# Patient Record
Sex: Male | Born: 1995 | Race: Black or African American | Hispanic: No | Marital: Single | Smoking: Never smoker
Health system: Southern US, Community
[De-identification: ages and names within clinical notes are randomized; demographics above are authoritative.]

## PROBLEM LIST (undated history)

## (undated) DIAGNOSIS — N62 Hypertrophy of breast: Secondary | ICD-10-CM

## (undated) DIAGNOSIS — E063 Autoimmune thyroiditis: Secondary | ICD-10-CM

## (undated) DIAGNOSIS — F845 Asperger's syndrome: Secondary | ICD-10-CM

## (undated) DIAGNOSIS — I10 Essential (primary) hypertension: Secondary | ICD-10-CM

## (undated) DIAGNOSIS — Z9119 Patient's noncompliance with other medical treatment and regimen: Secondary | ICD-10-CM

## (undated) DIAGNOSIS — E049 Nontoxic goiter, unspecified: Secondary | ICD-10-CM

## (undated) DIAGNOSIS — E782 Mixed hyperlipidemia: Secondary | ICD-10-CM

## (undated) DIAGNOSIS — E119 Type 2 diabetes mellitus without complications: Secondary | ICD-10-CM

## (undated) DIAGNOSIS — Z91199 Patient's noncompliance with other medical treatment and regimen due to unspecified reason: Secondary | ICD-10-CM

## (undated) DIAGNOSIS — E66813 Obesity, class 3: Secondary | ICD-10-CM

## (undated) DIAGNOSIS — F209 Schizophrenia, unspecified: Secondary | ICD-10-CM

## (undated) HISTORY — DX: Hypertrophy of breast: N62

## (undated) HISTORY — DX: Schizophrenia, unspecified: F20.9

## (undated) HISTORY — DX: Nontoxic goiter, unspecified: E04.9

## (undated) HISTORY — DX: Mixed hyperlipidemia: E78.2

## (undated) HISTORY — DX: Autoimmune thyroiditis: E06.3

## (undated) HISTORY — DX: Patient's noncompliance with other medical treatment and regimen: Z91.19

## (undated) HISTORY — PX: CIRCUMCISION: SUR203

## (undated) HISTORY — DX: Morbid (severe) obesity due to excess calories: E66.01

## (undated) HISTORY — DX: Essential (primary) hypertension: I10

## (undated) HISTORY — DX: Type 2 diabetes mellitus without complications: E11.9

## (undated) HISTORY — DX: Patient's noncompliance with other medical treatment and regimen due to unspecified reason: Z91.199

## (undated) HISTORY — DX: Obesity, class 3: E66.813

## (undated) HISTORY — DX: Asperger's syndrome: F84.5

---
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
## 2014-02-02 ENCOUNTER — Ambulatory Visit: Payer: BC Managed Care – PPO | Admitting: Pediatric Endocrinology

## 2014-03-09 ENCOUNTER — Telehealth: Payer: Self-pay | Admitting: Pediatric Endocrinology

## 2014-03-09 NOTE — Telephone Encounter (Signed)
appt made for 12/10, explained that they have had 2 no shows this year and any more will result in us no longer following him. KW

## 2014-04-15 ENCOUNTER — Ambulatory Visit (INDEPENDENT_AMBULATORY_CARE_PROVIDER_SITE_OTHER): Payer: BC Managed Care – PPO | Admitting: Pediatric Endocrinology

## 2014-04-15 ENCOUNTER — Encounter: Payer: Self-pay | Admitting: Pediatric Endocrinology

## 2014-04-15 VITALS — BP 120/75 | HR 76 | Wt 272.0 lb

## 2014-04-15 DIAGNOSIS — E669 Obesity, unspecified: Secondary | ICD-10-CM | POA: Diagnosis not present

## 2014-04-15 DIAGNOSIS — E1165 Type 2 diabetes mellitus with hyperglycemia: Secondary | ICD-10-CM | POA: Diagnosis not present

## 2014-04-15 DIAGNOSIS — IMO0002 Reserved for concepts with insufficient information to code with codable children: Secondary | ICD-10-CM

## 2014-04-15 DIAGNOSIS — N62 Hypertrophy of breast: Secondary | ICD-10-CM | POA: Diagnosis not present

## 2014-04-15 DIAGNOSIS — E119 Type 2 diabetes mellitus without complications: Secondary | ICD-10-CM

## 2014-04-15 LAB — GLUCOSE, POCT (MANUAL RESULT ENTRY): POC GLUCOSE: 107 mg/dL — AB (ref 70–99)

## 2014-04-15 LAB — POCT GLYCOSYLATED HEMOGLOBIN (HGB A1C): HEMOGLOBIN A1C: 7.4

## 2014-04-15 MED ORDER — LIRAGLUTIDE 18 MG/3ML ~~LOC~~ SOPN: 1.2000 mg | PEN_INJECTOR | Freq: Every day | SUBCUTANEOUS | Status: DC

## 2014-04-15 MED ORDER — METFORMIN HCL 1000 MG PO TABS: 1000.0000 mg | ORAL_TABLET | Freq: Two times a day (BID) | ORAL | Status: DC

## 2014-04-15 MED ORDER — INSULIN PEN NEEDLE 32G X 4 MM MISC: 1.0000 | Freq: Every day | Status: DC

## 2014-04-15 NOTE — Progress Notes (Signed)
Subjective:  Subjective Patient Name: Jared Potts Date of Birth: 1995-09-25  MRN: [id number]  Jared Potts  presents to the office today for follow-up evaluation and management of his type 2 diabetes, obesity, dyspepsia, schizophrenia, gynecomastia, hypertension, hyperlipidemia, thyroiditis, and noncompliance.  HISTORY OF PRESENT ILLNESS:   Jared Potts is a 18 y.o. AA male   Jared Potts was accompanied by himself  1. On 05/11/09 the then 18 year old patient was admitted to the pediatric ward at [name] for evaluation and treatment of new onset diabetes, mild-moderate diabetic ketoacidosis, dehydration, ketonuria, morbid obesity, acanthosis nigricans and gynecomastia in the setting of co-existent schizophrenia. Since his C-peptide was surprisingly low at 0.62 (normal 0.80-3.9) he was initially classified as having T1DM and was treated with Lantus as a basal insulin and Novolog aspart insulin, according to the 150/50/15 plan,  at mealtimes, bedtime, and 2:00 AM. Metformin was added to help with insulin resistance.     2. The patient's last [facility] visit was on 09/2113. In the interim, he has been generally healthy. He was using Bydureon last spring with a good reduction in his hemoglobin A1C to <6%. However, he had some discomfort with injection and when his prescription ran out he stopped taking his medication. When he was taking the medication he was less hungry and found that he got full faster. Since he has stopped taking it he has noticed sugars into the 200s- even when he does not feel he has eaten anything sweet. He admits that he has been eating a lot of sandwiches. He has even had sugars into the 300s.  He is not checking blood sugars regularly but has about a sugar per day on average. He is also doing some physical activity with a weight lifting class twice a week.  He is interested in trying a new treatment option and would prefer not to restart Bydureon. He has not recently been  taking his Metformin either.   3. Pertinent Review of Systems:  Constitutional: The patient feels "about an 7 on a scale of 1 to 10". The patient seems healthy and active. Eyes: Vision seems to be good. There are no recognized eye problems. Neck: The patient has no complaints of anterior neck swelling, soreness, tenderness, pressure, discomfort, or difficulty swallowing.   Heart: Heart rate increases with exercise or other physical activity. The patient has no complaints of palpitations, irregular heart beats, chest pain, or chest pressure.   Gastrointestinal: Bowel movents seem normal. The patient has no complaints of excessive hunger, acid reflux, upset stomach, stomach aches or pains, diarrhea, or constipation.  Legs: Muscle mass and strength seem normal. There are no complaints of numbness, tingling, burning, or pain. No edema is noted.  Feet: There are no obvious foot problems. There are no complaints of numbness, tingling, burning, or pain. No edema is noted. Neurologic: There are no recognized problems with muscle movement and strength, sensation, or coordination.  PAST MEDICAL, FAMILY, AND SOCIAL HISTORY  Past Medical History  Diagnosis Date  . Type 2 diabetes mellitus in patient age 6-19 years with HbA1C goal below 7.5   . Obesity, Class III, BMI 40-49.9 (morbid obesity)   . 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 Goiter   . Gynecomastia, male   . Hypertension   . Combined hyperlipidemia   . Attention deficit disorder   . Thyroiditis, autoimmune   . Noncompliance with treatment     Family History  Problem Relation Age of Onset  . Obesity Mother   . Diabetes  Maternal Aunt   . Obesity Maternal Aunt   . Diabetes Maternal Grandmother   . Obesity Maternal Grandmother   . Cancer Neg Hx     Current outpatient prescriptions: amphetamine-dextroamphetamine (ADDERALL) 30 MG tablet, Take 40 mg by mouth daily. , Disp: , Rfl: ;  ARIPiprazole (ABILIFY) 20 MG tablet, Take 20 mg by mouth daily.,  Disp: , Rfl: ;  Exenatide ER (BYDUREON) 2 MG PEN, Inject 2 mg once weekly, Disp: 4 each, Rfl: 6;  glucose blood (ONETOUCH VERIO) test strip, Check blood sugar 6 x daily, Disp: 200 each, Rfl: 6 Insulin Pen Needle 31G X 6 MM MISC, For use with [name] Pen Device. 1 Injection per day., Disp: 30 each, Rfl: 3;  metFORMIN (GLUCOPHAGE) 1000 MG tablet, Take 1 tablet (1,000 mg total) by mouth 2 (two) times daily with a meal., Disp: 60 tablet, Rfl: 4;  B-D ULTRAFINE III SHORT PEN 31G X 8 MM MISC, USE WITH INSULIN PEN DEVICE . INJECT INSULIN FIVE TIMES A DAY., Disp: 150 each, Rfl: 4 insulin aspart (NOVOLOG FLEXPEN) 100 UNIT/ML FlexPen, Use up to 50 units daily (Patient not taking: Reported on 04/15/2014), Disp: 5 pen, Rfl: 6;  Insulin Glargine (LANTUS SOLOSTAR) 100 UNIT/ML Solostar Pen, Use up to 50 units daily (Patient not taking: Reported on 04/15/2014), Disp: 5 pen, Rfl: 6;  Insulin Pen Needle (NOVOFINE PLUS) 32G X 4 MM MISC, 1 each by Does not apply route daily., Disp: 30 each, Rfl: 6 Liraglutide ([name]) 18 MG/3ML SOPN, Inject 1.2 mg into the skin daily., Disp: 2 pen, Rfl: 6  Allergies as of 04/15/2014  . (No Known Allergies)     reports that he has never smoked. He has never used smokeless tobacco. He reports that he does not drink alcohol or use illicit drugs. Pediatric History  Patient Guardian Status  . Mother:  Jared Potts,Jared Potts   Other Topics Concern  . Not on file   Social History Narrative   Lives with mom and dad and brother.   [employer] with [employer] of [employer]  Primary Care Provider: Evlyn Potts,Jared Potts, Jared Potts  ROS: There are no other significant problems involving Jared Potts's other body systems.    Objective:  Objective Vital Signs:  BP 120/75 mmHg  Pulse 76  Wt 272 lb (123.378 kg)   Ht Readings from Last 3 Encounters:  12/30/12 6' 0.44" (1.84 m) (87 %*, Z = 1.11)  11/17/12 6' 0.5" (1.842 m) (87 %*, Z = 1.14)  06/17/12 6' 0.44" (1.84 m) (88 %*, Z = 1.16)   * Growth percentiles  are based on CDC 2-20 Years data.   Wt Readings from Last 3 Encounters:  04/15/14 272 lb (123.378 kg) (100 %*, Z = 2.73)  09/24/13 267 lb (121.11 kg) (100 %*, Z = 2.66)  07/09/13 272 lb 9.6 oz (123.651 kg) (100 %*, Z = 2.74)   * Growth percentiles are based on CDC 2-20 Years data.   HC Readings from Last 3 Encounters:  [facility]   There is no height on file to calculate BSA. No height on file for this encounter. 100%ile (Z=2.73) based on CDC 2-20 Years weight-for-age data using vitals from 04/15/2014.    PHYSICAL EXAM:  Constitutional: The patient appears healthy and well nourished. The patient's height and weight are advanced for age.  Head: The head is normocephalic. Face: The face appears normal. There are no obvious dysmorphic features. Eyes: The eyes appear to be normally formed and spaced. Gaze is conjugate. There is  no obvious arcus or proptosis. Moisture appears normal. Ears: The ears are normally placed and appear externally normal. Mouth: The oropharynx and tongue appear normal. Dentition appears to be normal for age. Oral moisture is normal. Neck: The neck appears to be visibly normal. The thyroid gland is 15 grams in size. The consistency of the thyroid gland is normal. The thyroid gland is not tender to palpation. +acanthosis Lungs: The lungs are clear to auscultation. Air movement is good. Heart: Heart rate and rhythm are regular. Heart sounds S1 and S2 are normal. I did not appreciate any pathologic cardiac murmurs. Abdomen: The abdomen appears to be normal in size for the patient's age. Bowel sounds are normal. There is no obvious hepatomegaly, splenomegaly, or other mass effect.  Arms: Muscle size and bulk are normal for age. Hands: There is no obvious tremor. Phalangeal and metacarpophalangeal joints are normal. Palmar muscles are normal for age. Palmar skin is normal. Palmar moisture is also normal. Legs: Muscles appear normal for age. No edema is  present. Feet: Feet are normally formed. Dorsalis pedal pulses are normal. Multiple toe nails black and deformed. Right great toe with nail rising from nail bed.  Neurologic: Strength is normal for age in both the upper and lower extremities. Muscle tone is normal. Sensation to touch is normal in both the legs and feet.   GYN: + gynecomastia  LAB DATA:  Results for orders placed or performed in visit on 04/15/14  POCT Glucose (CBG)  Result Value Ref Range   POC Glucose 107 (A) 70 - 99 mg/dl  POCT HgB [id number]  Result Value Ref Range   Hemoglobin A1C 7.4         Assessment and Plan:  Assessment ASSESSMENT:  1. Type 2 diabetes- Had excellent reduction in A1C at last visit when he was taking his Metformin and Bydureon. However, he is not currently taking either and he has had significant increase in A1C level. 2. Weight- modest weight gain.  3. Acanthosis- consistent with insulin resistance 4. 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   PLAN:  1. Diagnostic: A1C as above. Fasting labs prior to next visit.  2. Therapeutic: Restart Metformin. Start [name] at 0.6 mg per day x 1 week then 1.2 mg per day. Sample pen demo'd and given to patient in clinic today.  3. Patient education: Reviewed bg log and A1C result. Discussed weight loss goals and dietary changes. Discussed implications of missed clinic visits and discontinuation of his medication. He would like to start fresh now. 4. Follow-up: Return in about 3 months (around 07/15/2014).      Cammie SickleBADIK, Jarad Barth REBECCA, Jared Potts

## 2014-04-15 NOTE — Patient Instructions (Addendum)
Start [name] 0.6 mg daily x 1 week  Then 1.2 mg daily.  Continue to check blood sugars. You should not get hypoglycemic with this medication. Hopefully you will see sugars starting to improve by the end of the year.   Will recheck your A1C at next visit and see if we need to increase your dose further.   Restart Metformin. Start with 1 dose a day and increase back to 2 doses a day.

## 2014-06-14 ENCOUNTER — Other Ambulatory Visit: Payer: Self-pay | Admitting: *Deleted

## 2014-06-14 DIAGNOSIS — E119 Type 2 diabetes mellitus without complications: Secondary | ICD-10-CM

## 2014-06-25 ENCOUNTER — Encounter: Payer: Self-pay | Admitting: *Deleted

## 2014-06-25 NOTE — Progress Notes (Signed)
On April 15, 2014 patient was given 1 sample victoza pen lot# [id number], exp 2/17

## 2014-07-13 ENCOUNTER — Telehealth: Payer: Self-pay | Admitting: "Endocrinology

## 2014-07-13 ENCOUNTER — Telehealth: Payer: Self-pay | Admitting: *Deleted

## 2014-07-13 NOTE — Telephone Encounter (Signed)
Received telephone call from other. Dr. Tomasa Randunningham put him on olanzapine in December 2015. BGs increased in January and have stayed fairly  high ever since. He stopped his [name] and is taking Lantus. His Lantus dose is 50 units. 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  1. BG log: 2 AM, Breakfast, Lunch, Supper, Bedtime 07/11/14: xxx, xxx, 358 post-prandial, xxx, xxx 07/12/14: xxx, xxx, 290, 345, xxx 07/12/14: xxx, xxx, xxx, 345, xxx 2. Assessment:   A. I don't have enough data to make an intelligent decision about adjusting his Lantus insulin dose.   B. Due to his mental health problems, Jared Potts lacks the insight to see how his non-compliance affects his health. Due to his inability to focus on the daily tasks of DM care, Jared Potts need a very simplified regimen that will not predispose him to hypoglycemia. He was doing well on the combination of Bydureon and metformin before, but he stopped taking them. Mom wants to blame his poor BG controls on olanzapine. While the olanzapine may or may not cause an increase in appetite, it is not adversely affecting Jared Potts's ability to take care of his DM. It is his inherent mental health problems that are doing so. Mom, understandably, wants Jared Potts to be normal and hopes that each new medication will do the trick.   C. If Jared Potts will allow his mother to supervise his DM care and if mom will do so, then the combination of Bydureon and metformin can work very well.  3. Plan: Check BGs regularly for 3 days and call me with the results. Plan to re-start him on Bydureon and metformin. Jared Potts,Cheria Sadiq J

## 2014-07-13 NOTE — Telephone Encounter (Signed)
Spoke to mother, she advises that Jared Potts's sugars have been running high since his behavorial health doctor placed him on a new medication. She had him stop the [name] and restart lantus. Since Jared Potts is out and Jared Potts is full until mid April I offered to transfer his care to [facility]. Mom refuses that option. I made him an appt for 4/19 and told her to call tonight after 8pm and talk to Jared Potts about the amount of insulin he should be on. Mom states she would call this evening. KW

## 2014-07-18 ENCOUNTER — Telehealth: Payer: Self-pay | Admitting: "Endocrinology

## 2014-07-18 NOTE — Telephone Encounter (Signed)
Received telephone call from mother. 1. Overall status: BGs are still running high. 2. New problems: None 3. Lantus dose: Mom inadvertently stopped the Lantus on 07/13/14. Do not take any further Lantus. 4. Metformin, 500 mg, twice daily 5. BG log: 2 AM, Breakfast, Lunch, Supper, Bedtime 07/16/14: 314, xxx, 339, xxx, 335 07/17/14: xxx, xxx, 268, xxx, 172 07/18/14: xxx, 217, 276, 416 6. Assessment: BGs are very variable based upon what Karis JubaQuailan eats and what medicines he takes. A trial of [name] plus metformin is indicated. He has a fresh [name] pen in the refrigerator.  7. Plan:  Start [name]: Mom must supervise each dose.   A. Tonight take 0.6 mg.   B. Increase the dose by one click every night until reaching a dose of 1.2 mg.   C. At 1.2 mg, increase by one click per week until you reach a maximum dose of 1.8 mg or you have too much heartburn or reflux to continue increasing the [name].  8. FU call: next Sunday Delonte Musich J  Molli KnockBRENNAN,Keishaun Hazel J

## 2014-07-23 ENCOUNTER — Other Ambulatory Visit: Payer: Self-pay | Admitting: Pediatric Endocrinology

## 2014-07-26 ENCOUNTER — Ambulatory Visit: Payer: BC Managed Care – PPO | Admitting: Pediatric Endocrinology

## 2014-08-06 ENCOUNTER — Other Ambulatory Visit: Payer: Self-pay | Admitting: *Deleted

## 2014-08-06 DIAGNOSIS — IMO0002 Reserved for concepts with insufficient information to code with codable children: Secondary | ICD-10-CM

## 2014-08-06 DIAGNOSIS — E1065 Type 1 diabetes mellitus with hyperglycemia: Secondary | ICD-10-CM

## 2014-08-09 ENCOUNTER — Other Ambulatory Visit: Payer: Self-pay | Admitting: Pediatric Endocrinology

## 2014-08-24 ENCOUNTER — Encounter: Payer: Self-pay | Admitting: "Endocrinology

## 2014-08-24 ENCOUNTER — Ambulatory Visit (INDEPENDENT_AMBULATORY_CARE_PROVIDER_SITE_OTHER): Payer: BLUE CROSS/BLUE SHIELD | Admitting: "Endocrinology

## 2014-08-24 VITALS — BP 123/79 | HR 77 | Wt 257.0 lb

## 2014-08-24 DIAGNOSIS — E1165 Type 2 diabetes mellitus with hyperglycemia: Secondary | ICD-10-CM | POA: Diagnosis not present

## 2014-08-24 DIAGNOSIS — IMO0002 Reserved for concepts with insufficient information to code with codable children: Secondary | ICD-10-CM

## 2014-08-24 DIAGNOSIS — L83 Acanthosis nigricans: Secondary | ICD-10-CM

## 2014-08-24 DIAGNOSIS — E049 Nontoxic goiter, unspecified: Secondary | ICD-10-CM

## 2014-08-24 DIAGNOSIS — E11649 Type 2 diabetes mellitus with hypoglycemia without coma: Secondary | ICD-10-CM

## 2014-08-24 LAB — POCT URINALYSIS DIPSTICK

## 2014-08-24 LAB — GLUCOSE, POCT (MANUAL RESULT ENTRY): POC Glucose: 283 mg/dl — AB (ref 70–99)

## 2014-08-24 LAB — POCT GLYCOSYLATED HEMOGLOBIN (HGB A1C): Hemoglobin A1C: 10.3

## 2014-08-24 MED ORDER — LANTUS SOLOSTAR 100 UNIT/ML ~~LOC~~ SOPN: 50.0000 [IU] | PEN_INJECTOR | SUBCUTANEOUS | Status: DC

## 2014-08-24 NOTE — Patient Instructions (Signed)
Follow up visit in 3 months. Please re-start Lantus insulin at a dose of 35 units per day. Continue metformin, 500 mg, twice daily. Continue [name], but reduce dose to 1.2 plus 5 clicks now. Advance the [name] dose by one click per week until you have vomiting or until you reach the dose of 1.8 mg/day. Call in one month on a Sunday evening between 8:00-9:30 PM to discuss BGs.

## 2014-08-24 NOTE — Progress Notes (Signed)
Subjective:  Subjective Patient Name: Jared Potts Date of Birth: 1995/06/09  MRN: [id number]  Jared ServeQuailan Fluty  presents to the office today for follow-up evaluation and management of his type 2 diabetes, obesity, dyspepsia, schizophrenia, gynecomastia, hypertension, hyperlipidemia, thyroiditis, and noncompliance.  HISTORY OF PRESENT ILLNESS:   Jared Potts is a 19 y.o. African-American young man.    Jared Potts was accompanied by his mother.   1. On 05/11/09 the then 19 year old patient was admitted to the pediatric ward at [facility] for evaluation and treatment of new onset diabetes, mild-moderate diabetic ketoacidosis, dehydration, ketonuria, morbid obesity, acanthosis nigricans and gynecomastia in the setting of co-existent schizophrenia. Since his C-peptide was surprisingly low at 0.62 (normal 0.80-3.9) he was initially classified as having T1DM and was treated with Lantus as a basal insulin and Novolog aspart insulin, according to the 150/50/15 plan,  at mealtimes, bedtime, and 2:00 AM. Metformin was added to help with insulin resistance.     2. The patient's last [facility] visit was on 04/15/14. In the interim, he has been generally healthy. 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 He is very physically active on the job. He often does not eat as much or eat at all. He is also going to [employer].  He is checking blood sugars more regularly. He is taking 1.8 mg of [name] daily.  He occasionally has some vomiting in the afternoons or evenings when he is active. This problem occurs 2-3 times per month. Dr. Tomasa Randunningham [facility] him [facility] ol[facility]apine [facility]. He tends to eat a big late supper meal after work. He ran out of Lantus last month, but did not ask for a refill. Marland Kitchen.He occasionally takes Novolog. He remains on metformin, 500 mg, twice daily.   3. Pertinent Review of Systems:  Constitutional: The patient feels "okay". The patient seems healthy and  active. Eyes: Vision seems to be good. There are no recognized eye problems. His last eye exam was about one year ago. Neck: The patient has no complaints of anterior neck swelling, soreness, tenderness, pressure, discomfort, or difficulty swallowing.   Heart: Heart rate increases with exercise or other physical activity. The patient has no complaints of palpitations, irregular heart beats, chest pain, or chest pressure.   Gastrointestinal: As above. Bowel movents seem normal. The patient has no complaints of excessive hunger, acid reflux, upset stomach, stomach aches or pains, diarrhea, or constipation.  Legs: Muscle mass and strength seem normal. There are no complaints of numbness, tingling, burning, or pain. No edema is noted.  Feet: There are no obvious foot problems. There are no complaints of numbness, tingling, burning, or pain. No edema is noted. Neurologic: There are no recognized problems with muscle movement and strength, sensation, or coordination. Emotional: He is doing okay. Mom feels that he is better.   4. BG printout: He checks  BGs 0-4 times per day, mostly 2-3 times per day. His average BG is 329. BG range is 107-581.   PAST MEDICAL, FAMILY, AND SOCIAL HISTORY  Past Medical History  Diagnosis Date  . Type 2 diabetes mellitus in patient age 20-19 years with HbA1C goal below 7.5   . Obesity, Class III, BMI 40-49.9 (morbid obesity)   . 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 Goiter   . Gynecomastia, male   . Hypertension   . Combined hyperlipidemia   . Attention deficit disorder   . Thyroiditis, autoimmune   . Noncompliance with treatment  Family History  Problem Relation Age of Onset  . Obesity Mother   . Diabetes Maternal Aunt   . Obesity Maternal Aunt   . Diabetes Maternal Grandmother   . Obesity Maternal Grandmother   . Cancer Neg Hx      Current outpatient prescriptions:  .  amphetamine-dextroamphetamine (ADDERALL) 30 MG tablet, Take 40 mg by mouth daily. ,  Disp: , Rfl:  .  B-D ULTRAFINE III SHORT PEN 31G X 8 MM MISC, USE WITH INSULIN PEN DEVICE . INJECT INSULIN FIVE TIMES A DAY., Disp: 150 each, Rfl: 4 .  glucose blood (ONETOUCH VERIO) test strip, Check blood sugar 6 x daily, Disp: 200 each, Rfl: 6 .  Insulin Pen Needle (NOVOFINE PLUS) 32G X 4 MM MISC, 1 each by Does not apply route daily., Disp: 30 each, Rfl: 6 .  Insulin Pen Needle 31G X 6 MM MISC, For use with [name] Pen Device. 1 Injection per day., Disp: 30 each, Rfl: 3 .  Liraglutide ([name]) 18 MG/3ML SOPN, Inject 1.2 mg into the skin daily., Disp: 2 pen, Rfl: 6 .  metFORMIN (GLUCOPHAGE) 1000 MG tablet, Take 1 tablet (1,000 mg total) by mouth 2 (two) times daily with a meal., Disp: 60 tablet, Rfl: 4 .  ARIPiprazole (ABILIFY) 20 MG tablet, Take 20 mg by mouth daily., Disp: , Rfl:  .  BYDUREON 2 MG PEN, INJECT 2 MG ONCE WEEKLY (Patient not taking: Reported on 08/24/2014), Disp: 4 each, Rfl: 0 .  Cariprazine HCl 3 MG CAPS, Take by mouth., Disp: , Rfl:  .  insulin aspart (NOVOLOG FLEXPEN) 100 UNIT/ML FlexPen, Use up to 50 units daily (Patient not taking: Reported on 08/24/2014), Disp: 5 pen, Rfl: 6 .  LANTUS SOLOSTAR 100 UNIT/ML Solostar Pen, USE UP TO 50 UNITS DAILY SUBCUTANEOUSLY (Patient not taking: Reported on 08/24/2014), Disp: 5 pen, Rfl: 3  Allergies as of 08/24/2014  . (No Known Allergies)     reports that he has never smoked. He has never used smokeless tobacco. He reports that he does not drink alcohol or use illicit drugs. Pediatric History  Patient Guardian Status  . Mother:  Earnshaw,[name]   Other Topics Concern  . Not on file   Social History Narrative   Lives with mom and dad and brother.   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 He also works about 20 hours per week at [employer].  Activities: Walking at work. Primary Care Provider: Evlyn Courier, MD  REVIEW OF SYSTEMS: There are no other significant problems involving Goebel's other body systems.     Objective:  Objective Vital Signs:  BP 123/79 mmHg  Pulse 77  Wt 257 lb (116.574 kg)   Ht Readings from Last 3 Encounters:  12/30/12 6' 0.44" (1.84 m) (87 %*, Z = 1.11)  11/17/12 6' 0.5" (1.842 m) (87 %*, Z = 1.14)  06/17/12 6' 0.44" (1.84 m) (88 %*, Z = 1.16)   * Growth percentiles are based on CDC 2-20 Years data.   Wt Readings from Last 3 Encounters:  08/24/14 257 lb (116.574 kg) (99 %*, Z = 2.51)  04/15/14 272 lb (123.378 kg) (100 %*, Z = 2.73)  09/24/13 267 lb (121.11 kg) (100 %*, Z = 2.66)   * Growth percentiles are based on CDC 2-20 Years data.   HC Readings from Last 3 Encounters:  No data found for [facility]   There is no height on file to calculate BSA. No height on file for this encounter.  99%ile (Z=2.51) based on CDC 2-20 Years weight-for-age data using vitals from 08/24/2014.    PHYSICAL EXAM:  Constitutional: The patient appears healthy and well nourished. The patient's height is at the 87%. His weight is at the 99%. He has lost 15 pounds since last visit. His BMI is at the 99.29%. He is brighter, more interactive, and less concrete today than I've ever seen him. His affect is normal. His insight is still somewhat limited, but better.  Head: The head is normocephalic. Face: The face appears normal. There are no obvious dysmorphic features. Eyes: The eyes appear to be normally formed and spaced. Gaze is conjugate. There is no obvious arcus or proptosis. Moisture appears normal. Ears: The ears are normally placed and appear externally normal. Mouth: The oropharynx and tongue appear normal. Dentition appears to be normal for age. Oral moisture is normal. Neck: The neck appears to be visibly normal. The thyroid gland is 23-25 grams in size. Both lobes are enlarged fairly symmetrically today. The consistency of the thyroid gland is normal. The thyroid gland is not tender to palpation. He has +acanthosis Lungs: The lungs are clear to auscultation. Air movement is  good. Heart: Heart rate and rhythm are regular. Heart sounds S1 and S2 are normal. I did not appreciate any pathologic cardiac murmurs. Abdomen: The abdomen is quite enlarged. Bowel sounds are normal. There is no obvious hepatomegaly, splenomegaly, or other mass effect.  Arms: Muscle size and bulk are normal for age. Hands: There is no obvious tremor. Phalangeal and metacarpophalangeal joints are normal. Palmar muscles are normal for age. Palmar skin is normal. Palmar moisture is also normal. Legs: Muscles appear normal for age. No edema is present. Feet: Feet are normally formed. Dorsalis pedal pulses are faint 1+ bilaterally. normal. Multiple toe nails are black and deformed. Right great toe nail is rising from nail bed. He has 2+ tinea pedis bilaterally. Neurologic: Strength is normal for age in both the upper and lower extremities. Muscle tone is normal. Sensation to touch is normal in both the legs and feet.     LAB DATA:  Results for orders placed or performed in visit on 08/24/14  POCT Glucose (CBG)  Result Value Ref Range   POC Glucose 283 (A) 70 - 99 mg/dl  POCT HgB N8G  Result Value Ref Range   Hemoglobin A1C 10.3   POCT urinalysis dipstick  Result Value Ref Range   Color, UA     Clarity, UA     Glucose, UA     Bilirubin, UA     Ketones, UA small    Spec Grav, UA     Blood, UA     pH, UA     Protein, UA     Urobilinogen, UA     Nitrite, UA     Leukocytes, UA          Assessment and Plan:  Assessment ASSESSMENT:  1. Type 2 diabetes: Today's HbA1c of 10.3% is the highest that he has ever had, despite losing some fat weight. He appears to be producing less endogenous insulin over time. He has also not been taking any exogenous insulin for at least one month. We have not had enough time for a trial of Lantus and [name] to see how well that combination will work. We also do not know if his C-peptide has decreased in the past two years.  2. Ketonuria: Since he is not  acutely ill, the reason for his ketonuria must be under-insulinzation.  3. Obesity: His weight is lower. Is this because he has been working out more? Is this because he is under-insulinized? Does he have both causes operating on him simultaneously? I suspect that under-insulinization is the major factor because his BGs have been increasing. 4. Acanthosis: This problem is static.  5. Goiter: He appears to have much more thyroid enlargement than he had at last visit.    6. Hypoglycemia: He has not had any documented low BGs in the past month.   PLAN:  1. Diagnostic: A1C as above. Fasting labs within the next week. Add C-peptide.  2. Therapeutic: Restart Lantus at 35 units per day. Continue metformin, 500 mg, twice a day. Continue [name], but reduce dose to 1.2 +5 clicks.  3. Patient education: Reviewed BG printout. Discussed BG goals, weight loss goals, and dietary changes.  4. Follow-up: 3 months    Level of Service: This visit lasted in excess of 90 minutes. More than 50% of the visit was devoted to counseling.    David Stall, MD

## 2014-08-27 ENCOUNTER — Other Ambulatory Visit: Payer: Self-pay | Admitting: Pediatric Endocrinology

## 2014-11-22 ENCOUNTER — Ambulatory Visit (INDEPENDENT_AMBULATORY_CARE_PROVIDER_SITE_OTHER): Payer: BLUE CROSS/BLUE SHIELD | Admitting: "Endocrinology

## 2014-11-22 ENCOUNTER — Encounter: Payer: Self-pay | Admitting: "Endocrinology

## 2014-11-22 VITALS — BP 116/71 | HR 80 | Wt 245.7 lb

## 2014-11-22 DIAGNOSIS — E1165 Type 2 diabetes mellitus with hyperglycemia: Secondary | ICD-10-CM

## 2014-11-22 DIAGNOSIS — E11649 Type 2 diabetes mellitus with hypoglycemia without coma: Secondary | ICD-10-CM | POA: Diagnosis not present

## 2014-11-22 DIAGNOSIS — IMO0002 Reserved for concepts with insufficient information to code with codable children: Secondary | ICD-10-CM

## 2014-11-22 DIAGNOSIS — L83 Acanthosis nigricans: Secondary | ICD-10-CM | POA: Diagnosis not present

## 2014-11-22 DIAGNOSIS — E049 Nontoxic goiter, unspecified: Secondary | ICD-10-CM

## 2014-11-22 LAB — GLUCOSE, POCT (MANUAL RESULT ENTRY): POC Glucose: 96 mg/dl (ref 70–99)

## 2014-11-22 LAB — POCT GLYCOSYLATED HEMOGLOBIN (HGB A1C): Hemoglobin A1C: 8.7

## 2014-11-22 NOTE — Patient Instructions (Signed)
Follow up visit in 3 months. 

## 2014-11-22 NOTE — Progress Notes (Signed)
Subjective:  Subjective Patient Name: Jared Potts Date of Birth: 1995/10/03  MRN: [id number]  Jared Potts  presents to the office today for follow-up evaluation and management of his type 2 diabetes, obesity, dyspepsia, schizophrenia, gynecomastia, hypertension, hyperlipidemia, thyroiditis, and noncompliance.  HISTORY OF PRESENT ILLNESS:   Jared Potts is a 19 y.o. African-American young man.    Jared Potts was accompanied by his mother.   1. On 05/11/09 the then 40 year old patient was admitted to the pediatric ward at [facility] for evaluation and treatment of new onset diabetes, mild-moderate diabetic ketoacidosis, dehydration, ketonuria, morbid obesity, acanthosis nigricans and gynecomastia in the setting of co-existent schizophrenia. Since his C-peptide was surprisingly low at 0.62 (normal 0.80-3.9) he was initially classified as having T1DM and was treated with Lantus as a basal insulin and Novolog aspart insulin, according to the 150/50/15 plan,  at mealtimes, bedtime, and 2:00 AM. Metformin was added to help with insulin resistance.     2. During his first year of having diabetes his BGs improved significantly. His HbA1c had decreased to 5.5% in July 2011, his C-peptide had increased to 8.45 in November 2011, and his insulin doses were progressively tapered. By September 2012 he had missed many doses of Lantus and Novolog, but his HbA1c had only increased to 6.1%. He had stopped taking insulin on his own by May 2013, but was still sometimes taking metformin. He subsequently stopped metformin as well. In October 2013 his C-peptide had increased to 2.58. I started him on [name] in February 2014. By July his C-peptide had decreased to 1.08. At that time we discontinued [name] due to nausea and I re-started him on his Lantus-Novolog plan. By February 2015, however, he had discontinued the Lantus and took the Novolog only sporadically. He was started on Bydureon that Spring, but  discontinued that drug soon after starting it due to the injections being painful. I re-started him on metformin and [name] in December 2015. He also took Lantus and Novolog infrequently. His HBA1c values in the past two years have varied from 5.8% to 10.3%.  3. The patient's last [facility] visit was on 08/24/14. In the interim, he has been generally healthy. 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 He is very physically active on the job. He often does not eat as much or eat at all. He is also going to [employer]  He is not checking blood sugars very often. He stopped taking [name] daily due to nausea at work. He still has nausea frequently and some vomiting at work, despite stopping [name]. He can't really remember how often these symptoms occur, but they did occur yesterday. Dr. Tomasa Rand put him on Varylar prior to last visit. He no longer eats a big dinner meal. Adderall takes away his appetite. He rarely takes Lantus or Novolog insulins, but usually takes metformin, 500 mg, twice daily.  .  3. Pertinent Review of Systems:  Constitutional: The patient feels "okay now, but will be more tired later in the day at work".  Eyes: Vision seems to be good. There are no recognized eye problems. His last eye exam was about one year ago. Neck: The patient has no complaints of anterior neck swelling, soreness, tenderness, pressure, discomfort, or difficulty swallowing.   Heart: Heart rate increases with exercise or other physical activity. The patient has no complaints of palpitations, irregular heart beats, chest pain, or chest pressure.   Gastrointestinal: As above. Bowel movents  seem normal. The patient has no complaints of acid reflux, stomach aches or pains, diarrhea, or constipation.  Legs: Muscle mass and strength seem normal. There are no complaints of numbness, tingling, burning, or pain. No edema is noted.  Feet: He has calluses of his feet. He  saw ortho and was recommended to wear bigger shoes and work boots. There are no other obvious foot problems. There are no complaints of numbness, tingling, burning, or pain. No edema is noted.  Neurologic: There are no recognized problems with muscle movement and strength, sensation, or coordination. Emotional: He is doing okay. Mom feels that he is better.   4. BG printout: He checked  BGs 8 times in the past 4 weeks. His average BG is 187, compared with 329 at his last visit. BGs varied from 137-214, compared with 107-581 at last visit. Marland Kitchen   PAST MEDICAL, FAMILY, AND SOCIAL HISTORY  Past Medical History  Diagnosis Date  . Type 2 diabetes mellitus in patient age 25-19 years with HbA1C goal below 7.5   . Obesity, Class III, BMI 40-49.9 (morbid obesity)   . 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 Goiter   . Gynecomastia, male   . Hypertension   . Combined hyperlipidemia   . Attention deficit disorder   . Thyroiditis, autoimmune   . Noncompliance with treatment     Family History  Problem Relation Age of Onset  . Obesity Mother   . Diabetes Maternal Aunt   . Obesity Maternal Aunt   . Diabetes Maternal Grandmother   . Obesity Maternal Grandmother   . Cancer Neg Hx      Current outpatient prescriptions:  .  amphetamine-dextroamphetamine (ADDERALL) 30 MG tablet, Take 40 mg by mouth daily. , Disp: , Rfl:  .  B-D ULTRAFINE III SHORT PEN 31G X 8 MM MISC, USE WITH INSULIN PEN DEVICE . INJECT INSULIN FIVE TIMES A DAY., Disp: 150 each, Rfl: 4 .  Cariprazine HCl 3 MG CAPS, Take by mouth., Disp: , Rfl:  .  Insulin Pen Needle (NOVOFINE PLUS) 32G X 4 MM MISC, 1 each by Does not apply route daily., Disp: 30 each, Rfl: 6 .  Insulin Pen Needle 31G X 6 MM MISC, For use with [name] Pen Device. 1 Injection per day., Disp: 30 each, Rfl: 3 .  LANTUS SOLOSTAR 100 UNIT/ML Solostar Pen, Inject 50 Units into the skin 1 day or 1 dose. Inject up to 50 units once daily., Disp: 5 pen, Rfl: 3 .  Liraglutide ([name])  18 MG/3ML SOPN, Inject 1.2 mg into the skin daily., Disp: 2 pen, Rfl: 6 .  metFORMIN (GLUCOPHAGE) 1000 MG tablet, Take 1 tablet (1,000 mg total) by mouth 2 (two) times daily with a meal., Disp: 60 tablet, Rfl: 4 .  ONETOUCH VERIO test strip, USE TO CHECK BLOOD SUGAR 6 TIMES DAILY, Disp: 200 each, Rfl: 6 .  ARIPiprazole (ABILIFY) 20 MG tablet, Take 20 mg by mouth daily., Disp: , Rfl:  .  BYDUREON 2 MG PEN, INJECT 2 MG ONCE WEEKLY (Patient not taking: Reported on 08/24/2014), Disp: 4 each, Rfl: 0  Allergies as of 11/22/2014  . (No Known Allergies)     reports that he has never smoked. He has never used smokeless tobacco. He reports that he does not drink alcohol or use illicit drugs. Pediatric History  Patient Guardian Status  . Mother:  Sager,[name]   Other Topics Concern  . Not on file   Social History Narrative   Lives with mom  and dad and brother.   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   Activities: Walking at work. Primary Care Provider: Evlyn Courier, MD  Psychiatry: Dr. Beverly Milch  REVIEW OF SYSTEMS: There are no other significant problems involving Humza's other body systems.    Objective:  Objective Vital Signs:  BP 116/71 mmHg  Pulse 80  Wt 245 lb 11.2 oz (111.449 kg)   Ht Readings from Last 3 Encounters:  12/30/12 6' 0.44" (1.84 m) (87 %*, Z = 1.11)  11/17/12 6' 0.5" (1.842 m) (87 %*, Z = 1.14)  06/17/12 6' 0.44" (1.84 m) (88 %*, Z = 1.16)   * Growth percentiles are based on CDC 2-20 Years data.   Wt Readings from Last 3 Encounters:  11/22/14 245 lb 11.2 oz (111.449 kg) (99 %*, Z = 2.32)  08/24/14 257 lb (116.574 kg) (99 %*, Z = 2.51)  04/15/14 272 lb (123.378 kg) (100 %*, Z = 2.73)   * Growth percentiles are based on CDC 2-20 Years data.   HC Readings from Last 3 Encounters:  No data found for [facility]   There is no height on file to calculate BSA. No height on file for  this encounter. 99%ile (Z=2.32) based on CDC 2-20 Years weight-for-age data using vitals from 11/22/2014.    PHYSICAL EXAM:  Constitutional: The patient appears healthy and well nourished. The patient's height is at the 87%. His weight is at the 99%. He has lost 8 more pounds since last visit.  He appears to be more tired and not as mentally bright and sharp as he was at his last visit. He was not very interactive, but was more concrete. His affect was fairly flat. His insight is still somewhat limited.  Head: The head is normocephalic. Face: The face appears normal. There are no obvious dysmorphic features. Eyes: The eyes appear to be normally formed and spaced. Gaze is conjugate. There is no obvious arcus or proptosis. Moisture appears normal. Ears: The ears are normally placed and appear externally normal. Mouth: The oropharynx and tongue appear normal. Dentition appears to be normal for age. Oral moisture is normal. Neck: The neck appears to be visibly normal. The thyroid gland is larger at about 24-25 grams in size. Both lobes are enlarged fairly symmetrically today. The consistency of the thyroid gland is normal. The thyroid gland is not tender to palpation. He has 2+acanthosis Lungs: The lungs are clear to auscultation. Air movement is good. Heart: Heart rate and rhythm are regular. Heart sounds S1 and S2 are normal. I did not appreciate any pathologic cardiac murmurs. Abdomen: The abdomen is quite enlarged. Bowel sounds are normal. There is no obvious hepatomegaly, splenomegaly, or other mass effect.  Arms: Muscle size and bulk are normal for age. Hands: There is no obvious tremor. Phalangeal and metacarpophalangeal joints are normal. Palmar muscles are normal for age. Palmar skin is normal. Palmar moisture is also normal. Legs: Muscles appear normal for age. No edema is present. Feet: Feet are somewhat flat. Dorsalis pedal pulses are faint 1+ bilaterally. normal. Multiple toe nails are  black and deformed. He has large calluses on the medial aspects of his great toes. He has 2+ tinea pedis bilaterally. Neurologic: Strength is normal for age in both the upper and lower extremities. Muscle tone is normal. Sensation to touch is normal in both the legs and feet.     LAB DATA:  Results for orders placed or performed in visit on 11/22/14  POCT Glucose (CBG)  Result Value Ref Range   POC Glucose 96 70 - 99 mg/dl   Labs 0/98//117/18//16: [id number] 8.7%, compared with 10.3% at his last visit.       Assessment and Plan:  Assessment ASSESSMENT:  1. Type 2 diabetes: Today's HbA1c of 8.7% is a big improvement. When he eats fewer carbs, exercises, and takes his medication his BGs improve.  2. Morbid obesity: He has lost more weight since his last visit. Since his HbA1c also decreased during this time, it is unlikely that underinsulinization was a major factor in his weight loss. Exercise and decreased appetite/increased satiety due to [name] and exercise are the two major factors that affect his weight now.  2. Acanthosis: This problem is static.  4. Goiter: He has more thyroid enlargement today than he had at last visit. The pattern of waxing and waning of thyroid gland size is c/w evolving Hashimoto's thyroiditis.    5. Hypoglycemia: He has not had any subjective symptoms of low BG in the past month.  6. Nausea: It is difficult to determine how much of his nausea might be due to metformin and/or [name] and how much may be due to intermittent heat exhaustion episodes.   PLAN:  1. Diagnostic: A1C as above. Fasting labs within the next week. Add C-peptide. Call me in two weeks on a Wednesday or Sunday evening. Consider approving his driver's license if he is checking his  BGs more frequently.  2. Therapeutic: Restart [name] at 1.2 mg/day. Then increase one click per week until he has nausea, and then back off. Continue metformin, 500 mg, twice a day.  3. Patient education: Reviewed BG  printout. Discussed BG goals, weight loss goals, and dietary changes.  4. Follow-up: 3 months    Level of Service: This visit lasted in excess of 45 minutes. More than 50% of the visit was devoted to counseling.    David StallBRENNAN,MICHAEL J, MD

## 2014-11-23 ENCOUNTER — Ambulatory Visit: Payer: BLUE CROSS/BLUE SHIELD | Admitting: "Endocrinology

## 2014-11-24 DIAGNOSIS — L83 Acanthosis nigricans: Secondary | ICD-10-CM | POA: Insufficient documentation

## 2015-02-24 ENCOUNTER — Ambulatory Visit (INDEPENDENT_AMBULATORY_CARE_PROVIDER_SITE_OTHER): Payer: BLUE CROSS/BLUE SHIELD | Admitting: Pediatric Endocrinology

## 2015-02-24 ENCOUNTER — Encounter: Payer: Self-pay | Admitting: Pediatric Endocrinology

## 2015-02-24 VITALS — BP 122/70 | HR 86 | Wt 244.8 lb

## 2015-02-24 DIAGNOSIS — Z23 Encounter for immunization: Secondary | ICD-10-CM

## 2015-02-24 DIAGNOSIS — IMO0001 Reserved for inherently not codable concepts without codable children: Secondary | ICD-10-CM

## 2015-02-24 DIAGNOSIS — E1165 Type 2 diabetes mellitus with hyperglycemia: Secondary | ICD-10-CM | POA: Diagnosis not present

## 2015-02-24 DIAGNOSIS — Z794 Long term (current) use of insulin: Secondary | ICD-10-CM

## 2015-02-24 DIAGNOSIS — L83 Acanthosis nigricans: Secondary | ICD-10-CM

## 2015-02-24 LAB — POCT GLYCOSYLATED HEMOGLOBIN (HGB A1C): HEMOGLOBIN A1C: 9.4

## 2015-02-24 LAB — GLUCOSE, POCT (MANUAL RESULT ENTRY): POC GLUCOSE: 263 mg/dL — AB (ref 70–99)

## 2015-02-24 MED ORDER — CANAGLIFLOZIN 100 MG PO TABS: 100.0000 mg | ORAL_TABLET | Freq: Every day | ORAL | Status: DC

## 2015-02-24 NOTE — Patient Instructions (Signed)
Start Invokana 100 mg daily IN ADDITION to what you are already doing.  Work on getting AT LEAST 3 checks EVERY DAY.  You got your flu shot today- make sure you move your arm!

## 2015-02-24 NOTE — Progress Notes (Signed)
Subjective:  Subjective Patient Name: Jared Potts Date of Birth: 1995/12/08  MRN: [id number]  Jared Potts  presents to the office today for follow-up evaluation and management of his type 2 diabetes, obesity, dyspepsia, schizophrenia, gynecomastia, hypertension, hyperlipidemia, thyroiditis, and noncompliance.  HISTORY OF PRESENT ILLNESS:   Jared Potts is a 19 y.o. African-American young man.    Jared Potts was accompanied by his mother.   1. On 05/11/09 the then 54 year old patient was admitted to the pediatric ward at [facility] for evaluation and treatment of new onset diabetes, mild-moderate diabetic ketoacidosis, dehydration, ketonuria, morbid obesity, acanthosis nigricans and gynecomastia in the setting of co-existent schizophrenia. Since his C-peptide was surprisingly low at 0.62 (normal 0.80-3.9) he was initially classified as having T1DM and was treated with Lantus as a basal insulin and Novolog aspart insulin, according to the 150/50/15 plan,  at mealtimes, bedtime, and 2:00 AM. Metformin was added to help with insulin resistance.     2. During his first year of having diabetes his BGs improved significantly. His HbA1c had decreased to 5.5% in July 2011, his C-peptide had increased to 8.45 in November 2011, and his insulin doses were progressively tapered. By September 2012 he had missed many doses of Lantus and Novolog, but his HbA1c had only increased to 6.1%. He had stopped taking insulin on his own by May 2013, but was still sometimes taking metformin. He subsequently stopped metformin as well. In October 2013 his C-peptide had increased to 2.58. I started him on [name] in February 2014. By July his C-peptide had decreased to 1.08. At that time we discontinued [name] due to nausea and I re-started him on his Lantus-Novolog plan. By February 2015, however, he had discontinued the Lantus and took the Novolog only sporadically. He was started on Bydureon that Spring, but  discontinued that drug soon after starting it due to the injections being painful. I re-started him on metformin and [name] in December 2015. He also took Lantus and Novolog infrequently. His HBA1c values in the past two years have varied from 5.8% to 10.3%.  3. The patient's last PSSG visit was on 11/22/14. In the interim, he has been generally healthy. He has continued to work 50-60 hours per week plus school. When he started back to school this fall his sugars were higher. He switched from [name] back to Lantus 50 units. He feels that he takes it most nights. He sees leaking at the injection site 1-3 times per month. He continues on Metformin  BID. When he gets home at night he is usually hungry. He rarely has time to eat properly during the day.   3. Pertinent Review of Systems:  Constitutional: The patient feels "8.5".  Eyes: Vision seems to be good. There are no recognized eye problems. His last eye exam was about one year ago. Neck: The patient has no complaints of anterior neck swelling, soreness, tenderness, pressure, discomfort, or difficulty swallowing.   Heart: Heart rate increases with exercise or other physical activity. The patient has no complaints of palpitations, irregular heart beats, chest pain, or chest pressure.   Gastrointestinal: As above. Bowel movents seem normal. The patient has no complaints of acid reflux, stomach aches or pains, diarrhea, or constipation.  Legs: Muscle mass and strength seem normal. There are no complaints of numbness, tingling, burning, or pain. No edema is noted.  Feet: He has calluses of his feet. He saw ortho and was recommended to wear bigger shoes and work boots. There are no other  obvious foot problems. There are no complaints of numbness, tingling, burning, or pain. No edema is noted.  Neurologic: There are no recognized problems with muscle movement and strength, sensation, or coordination. Emotional: He is doing okay. Mom feels that he is  better.   4. BG printout: Checking an average of 1.5 times per day. 45 total readings. Avg BG 199 +/- 82. Range 83-383. Some days with all sugars in target. Other days with no sugars at all.   Last visit:  He checked  BGs 8 times in the past 4 weeks. His average BG is 187, compared with 329 at his last visit. BGs varied from 137-214, compared with 107-581 at last visit. .   Annual Labs- has not done in the past 2 years.  PAST MEDICAL, FAMILY, AND SOCIAL HISTORY  Past Medical History  Diagnosis Date  . Type 2 diabetes mellitus in patient age 26-19 years with HbA1C goal below 7.5   . Obesity, Class III, BMI 40-49.9 (morbid obesity) (HCC)   . 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 Goiter   . Gynecomastia, male   . Hypertension   . Combined hyperlipidemia   . Attention deficit disorder   . Thyroiditis, autoimmune   . Noncompliance with treatment     Family History  Problem Relation Age of Onset  . Obesity Mother   . Diabetes Maternal Aunt   . Obesity Maternal Aunt   . Diabetes Maternal Grandmother   . Obesity Maternal Grandmother   . Cancer Neg Hx      Current outpatient prescriptions:  .  Cariprazine HCl 3 MG CAPS, Take by mouth., Disp: , Rfl:  .  dexmethylphenidate (FOCALIN XR) 20 MG 24 hr capsule, Take 20 mg by mouth daily., Disp: , Rfl:  .  Insulin Pen Needle (NOVOFINE PLUS) 32G X 4 MM MISC, 1 each by Does not apply route daily., Disp: 30 each, Rfl: 6 .  Insulin Pen Needle 31G X 6 MM MISC, For use with [name] Pen Device. 1 Injection per day., Disp: 30 each, Rfl: 3 .  LANTUS SOLOSTAR 100 UNIT/ML Solostar Pen, Inject 50 Units into the skin 1 day or 1 dose. Inject up to 50 units once daily., Disp: 5 pen, Rfl: 3 .  metFORMIN (GLUCOPHAGE) 1000 MG tablet, Take 1 tablet (1,000 mg total) by mouth 2 (two) times daily with a meal., Disp: 60 tablet, Rfl: 4 .  ONETOUCH VERIO test strip, USE TO CHECK BLOOD SUGAR 6 TIMES DAILY, Disp: 200 each, Rfl: 6 .  ARIPiprazole (ABILIFY) 20 MG tablet, Take  20 mg by mouth daily., Disp: , Rfl:  .  B-D ULTRAFINE III SHORT PEN 31G X 8 MM MISC, USE WITH INSULIN PEN DEVICE . INJECT INSULIN FIVE TIMES A DAY. (Patient not taking: Reported on 02/24/2015), Disp: 150 each, Rfl: 4 .  BYDUREON 2 MG PEN, INJECT 2 MG ONCE WEEKLY (Patient not taking: Reported on 08/24/2014), Disp: 4 each, Rfl: 0 .  Liraglutide ([name]) 18 MG/3ML SOPN, Inject 1.2 mg into the skin daily. (Patient not taking: Reported on 02/24/2015), Disp: 2 pen, Rfl: 6  Allergies as of 02/24/2015  . (No Known Allergies)     reports that he has never smoked. He has never used smokeless tobacco. He reports that he does not drink alcohol or use illicit drugs. Pediatric History  Patient Guardian Status  . Mother:  Gaona,[name]   Other Topics Concern  . Not on file   Social History Narrative   Lives with mom and  dad and brother.   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   Activities: Walking at work. Primary Care Provider: Evlyn Courier, MD  Psychiatry: Dr. Beverly Milch  REVIEW OF SYSTEMS: There are no other significant problems involving Tilmon's other body systems.    Objective:  Objective Vital Signs:  BP 122/70 mmHg  Pulse 86  Wt 244 lb 12.8 oz (111.041 kg)   Ht Readings from Last 3 Encounters:  12/30/12 6' 0.44" (1.84 m) (87 %*, Z = 1.11)  11/17/12 6' 0.5" (1.842 m) (87 %*, Z = 1.14)  06/17/12 6' 0.44" (1.84 m) (88 %*, Z = 1.16)   * Growth percentiles are based on CDC 2-20 Years data.   Wt Readings from Last 3 Encounters:  02/24/15 244 lb 12.8 oz (111.041 kg)  11/22/14 245 lb 11.2 oz (111.449 kg) (99 %*, Z = 2.32)  08/24/14 257 lb (116.574 kg) (99 %*, Z = 2.51)   * Growth percentiles are based on CDC 2-20 Years data.   HC Readings from Last 3 Encounters:  No data found for [facility]   There is no height on file to calculate BSA. Normalized stature-for-age data available only for age 35 to 20  years. Normalized weight-for-age data available only for age 35 to 20 years.    PHYSICAL EXAM:  Constitutional: The patient appears healthy and well nourished.  Head: The head is normocephalic. Face: The face appears normal. There are no obvious dysmorphic features. Eyes: The eyes appear to be normally formed and spaced. Gaze is conjugate. There is no obvious arcus or proptosis. Moisture appears normal. Ears: The ears are normally placed and appear externally normal. Mouth: The oropharynx and tongue appear normal. Dentition appears to be normal for age. Oral moisture is normal. Neck: The neck appears to be visibly normal. The thyroid gland is about 24-25 grams in size.  The consistency of the thyroid gland is normal. The thyroid gland is not tender to palpation. He has 1+acanthosis Lungs: The lungs are clear to auscultation. Air movement is good. Heart: Heart rate and rhythm are regular. Heart sounds S1 and S2 are normal. I did not appreciate any pathologic cardiac murmurs. Abdomen: The abdomen is quite enlarged. Bowel sounds are normal. There is no obvious hepatomegaly, splenomegaly, or other mass effect.  Arms: Muscle size and bulk are normal for age. Hands: There is no obvious tremor. Phalangeal and metacarpophalangeal joints are normal. Palmar muscles are normal for age. Palmar skin is normal. Palmar moisture is also normal. Legs: Muscles appear normal for age. No edema is present. Feet: Feet are somewhat flat. Dorsalis pedal pulses are faint 1+ bilaterally. normal. Multiple toe nails are black and deformed. He has large calluses on the medial aspects of his great toes. He has 2+ tinea pedis bilaterally. Neurologic: Strength is normal for age in both the upper and lower extremities. Muscle tone is normal. Sensation to touch is normal in both the legs and feet.     LAB DATA:  Results for orders placed or performed in visit on 02/24/15  POCT Glucose (CBG)  Result Value Ref Range   POC  Glucose 263 (A) 70 - 99 mg/dl  POCT HgB W0J  Result Value Ref Range   Hemoglobin A1C 9.4        Assessment and Plan:  Assessment ASSESSMENT:  1. Type 2 diabetes: Today's HbA1c is higher than last visit. Has been checking sugars  more often but missing meals and with inconsistent insulin use.  2. Morbid obesity: weight is stable. He is not taking [name].  2. Acanthosis: This problem is static.  4. Goiter:stable 5. Hypoglycemia: He has not had any subjective symptoms of low BG in the past month.   PLAN:  1. Diagnostic: A1C as above. Annual labs over due. Consider approving his driver's license if he is checking his  BGs more frequently.  2. Therapeutic:Continue Lantus 50 u/day. Continue metformin, 1000 mg, twice a day. Start Invokana 100 mg daily.  3. Patient education: Reviewed BG printout. Discussed BG goals, weight loss goals, and dietary changes. Discussed adding Invokana to help with postprandial hyperglycemia. Discussed that he is to not take his invokana if he is ill/not eating due to risk of DKA. He voiced understanding. Discussed flu shot today (recommended for all T1DM patients).   4. Follow-up: 3 months    Level of Service: This visit lasted in excess of 25 minutes. More than 50% of the visit was devoted to counseling.    Cammie SickleBADIK, Jess Toney REBECCA, MD

## 2015-04-14 ENCOUNTER — Ambulatory Visit (INDEPENDENT_AMBULATORY_CARE_PROVIDER_SITE_OTHER): Payer: BLUE CROSS/BLUE SHIELD | Admitting: Pediatrics

## 2015-04-14 ENCOUNTER — Other Ambulatory Visit: Payer: Self-pay | Admitting: *Deleted

## 2015-04-14 ENCOUNTER — Encounter: Payer: Self-pay | Admitting: Pediatrics

## 2015-04-14 VITALS — BP 119/71 | HR 68 | Wt 241.6 lb

## 2015-04-14 DIAGNOSIS — E669 Obesity, unspecified: Secondary | ICD-10-CM

## 2015-04-14 DIAGNOSIS — E119 Type 2 diabetes mellitus without complications: Secondary | ICD-10-CM

## 2015-04-14 DIAGNOSIS — L83 Acanthosis nigricans: Secondary | ICD-10-CM

## 2015-04-14 DIAGNOSIS — E782 Mixed hyperlipidemia: Secondary | ICD-10-CM

## 2015-04-14 DIAGNOSIS — Z794 Long term (current) use of insulin: Secondary | ICD-10-CM

## 2015-04-14 DIAGNOSIS — E1165 Type 2 diabetes mellitus with hyperglycemia: Secondary | ICD-10-CM

## 2015-04-14 DIAGNOSIS — IMO0001 Reserved for inherently not codable concepts without codable children: Secondary | ICD-10-CM

## 2015-04-14 LAB — VITAMIN B12: Vitamin B-12: 433 pg/mL (ref 211–911)

## 2015-04-14 LAB — TSH: TSH: 1.225 u[IU]/mL (ref 0.350–4.500)

## 2015-04-14 LAB — T3, FREE: T3, Free: 3.2 pg/mL (ref 2.3–4.2)

## 2015-04-14 LAB — GLUCOSE, POCT (MANUAL RESULT ENTRY): POC GLUCOSE: 114 mg/dL — AB (ref 70–99)

## 2015-04-14 LAB — T4, FREE: Free T4: 1.11 ng/dL (ref 0.80–1.80)

## 2015-04-14 MED ORDER — CANAGLIFLOZIN 100 MG PO TABS: 300.0000 mg | ORAL_TABLET | Freq: Every day | ORAL | Status: DC

## 2015-04-14 NOTE — Progress Notes (Signed)
Subjective:  Subjective Patient Name: Jared Potts Date of Birth: [birth year]  MRN: [id number]  Jared Potts  presents to the office today for follow-up evaluation and management of his type 2 diabetes, obesity, dyspepsia, schizophrenia, gynecomastia, hypertension, hyperlipidemia, thyroiditis, and noncompliance.  HISTORY OF PRESENT ILLNESS:   Jared Potts is a 19 y.o. African-American young man.    Thurmond was accompanied by his mother.   1. On 05/11/09 the then 61 year old patient was admitted to the pediatric ward at [facility] for evaluation and treatment of new onset diabetes, mild-moderate diabetic ketoacidosis, dehydration, ketonuria, morbid obesity, acanthosis nigricans and gynecomastia in the setting of co-existent schizophrenia. Since his C-peptide was surprisingly low at 0.62 (normal 0.80-3.9) he was initially classified as having T1DM and was treated with Lantus as a basal insulin and Novolog aspart insulin, according to the 150/50/15 plan,  at mealtimes, bedtime, and 2:00 AM. Metformin was added to help with insulin resistance.     2. During his first year of having diabetes his BGs improved significantly. His HbA1c had decreased to 5.5% in July 2011, his C-peptide had increased to 8.45 in November 2011, and his insulin doses were progressively tapered. By September 2012 he had missed many doses of Lantus and Novolog, but his HbA1c had only increased to 6.1%. He had stopped taking insulin on his own by May 2013, but was still sometimes taking metformin. He subsequently stopped metformin as well. In October 2013 his C-peptide had increased to 2.58. He was started him on [name] in February 2014. By July his C-peptide had decreased to 1.08. At that time we discontinued [name] due to nausea and he was re-started him on his Lantus-Novolog plan. By February 2015, however, he had discontinued the Lantus and took the Novolog only sporadically. He was started on Bydureon that Spring,  but discontinued that drug soon after starting it due to the injections being painful. He was re-started on metformin and [name] in December 2015. He also took Lantus and Novolog infrequently. Last visit [name] was discontinued and he was started on Invokana. His HBA1c values in the past two years have varied from 5.8% to 10.3%.    3. The patient's last PSSG visit was on 02/24/15. In the interim, he has been generally healthy. He has continues to work at [employer] from 4-10. Mother states he is no longer taking Lantus and discontinued it when he started Invokana as this brought his BG down so much ("was a miracle drug" ) that they worried continuing it would drop his BG too much. They have been off Lantus for the past (1 month). He continues on Metformin  BID with no issues. With the Invokana he states her drinks a great deal of water, multiple bottles a day and has no issues with his urination. When he gets home at night he is usually hungry and he eats multiple things that can be both carb and protein. He rarely has time to eat properly during the day, especially prior to work. He knows he is not checking his BG as much as he should. He doesn't check at work because he is always on the go. He feels like he is low at work and when this happens he feels warm inside and weak. He feels this way when he is less than 100. He also states he loss 3 lbs due to eating less but the foods he is eating is not too healthy.   4. Pertinent Review of Systems:  Constitutional: The patient feels "  good/well".  Eyes: Vision seems to be good. There are no recognized eye problems. His last eye exam was about one year ago (October 2015). Neck: The patient has no complaints of anterior neck swelling, soreness, tenderness, pressure, discomfort, or difficulty swallowing.   Heart: Heart rate increases with exercise or other physical activity. The patient has no complaints of palpitations, irregular heart beats, chest pain, or  chest pressure.   Gastrointestinal: As above. Bowel movents seem normal. The patient has no complaints of acid reflux, stomach aches or pains, diarrhea, or constipation.  Legs: Muscle mass and strength seem normal. There are no complaints of numbness, tingling, burning, or pain. No edema is noted.  Feet: He has calluses of his feet. He saw ortho and was recommended to wear bigger shoes and work boots. There are no other obvious foot problems. There are no complaints of numbness, tingling, burning, or pain. No edema is noted.  Neurologic: There are no recognized problems with muscle movement and strength, sensation, or coordination. Skin: State he has calluses on his hand. Uses lotion, does not wear gloves (suggested thick moisturizer and to wear gloves when working).   October 4. BG printout: Checking an average of 1.5 times per day. 45 total readings. Avg BG 199 +/- 82. Range 83-383. Some days with all sugars in target. Other days with no sugars at all.   Last visit:  He checked  BGs 8 times in the past 4 weeks. His average BG is 187, compared with 329 at his last visit. BGs varied from 137-214, compared with 107-581 at last visit.  December BG printout: Checking an average of 1.1 times per day. 45 total readings. Avg BG 145 +/- 33. Range 145-218. Some days with all sugars in target. Other days with no sugars at all.   Annual Labs- has not done in the past 2 years.  PAST MEDICAL, FAMILY, AND SOCIAL HISTORY  Past Medical History  Diagnosis Date  . Type 2 diabetes mellitus in patient age 36-19 years with HbA1C goal below 7.5   . Obesity, Class III, BMI 40-49.9 (morbid obesity) (HCC)   . 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 Goiter   . Gynecomastia, male   . Hypertension   . Combined hyperlipidemia   . Attention deficit disorder   . Thyroiditis, autoimmune   . Noncompliance with treatment     Family History  Problem Relation Age of Onset  . Obesity Mother   . Diabetes Maternal Aunt   .  Obesity Maternal Aunt   . Diabetes Maternal Grandmother   . Obesity Maternal Grandmother   . Cancer Neg Hx      Current outpatient prescriptions:  .  canagliflozin (INVOKANA) 100 MG TABS tablet, Take 3 tablets (300 mg total) by mouth daily., Disp: 90 tablet, Rfl: 3 .  dexmethylphenidate (FOCALIN XR) 20 MG 24 hr capsule, Take 20 mg by mouth daily., Disp: , Rfl:  .  Insulin Pen Needle 31G X 6 MM MISC, For use with [name] Pen Device. 1 Injection per day., Disp: 30 each, Rfl: 3 .  metFORMIN (GLUCOPHAGE) 1000 MG tablet, Take 1 tablet (1,000 mg total) by mouth 2 (two) times daily with a meal., Disp: 60 tablet, Rfl: 4 .  ONETOUCH VERIO test strip, USE TO CHECK BLOOD SUGAR 6 TIMES DAILY, Disp: 200 each, Rfl: 6 .  ARIPiprazole (ABILIFY) 20 MG tablet, Take 20 mg by mouth daily., Disp: , Rfl:  .  Cariprazine HCl 3 MG CAPS, Take by mouth., Disp: ,  Rfl:   Allergies as of 04/14/2015  . (No Known Allergies)     reports that he has never smoked. He has never used smokeless tobacco. He reports that he does not drink alcohol or use illicit drugs. Pediatric History  Patient Guardian Status  . Mother:  Fauteux,[name]   Other Topics Concern  . Not on file   Social History Narrative   Lives with mom and dad and brother.   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 Activities: Walking at work. Primary Care Provider: Evlyn Courier, MD  Psychiatry: Dr. Beverly Milch  REVIEW OF SYSTEMS: There are no other significant problems involving Sina's other body systems.    Objective:  Objective Vital Signs:  BP 119/71 mmHg  Pulse 68  Wt 241 lb 9.6 oz (109.589 kg)   Ht Readings from Last 3 Encounters:  12/30/12 6' 0.44" (1.84 m) (87 %*, Z = 1.11)  11/17/12 6' 0.5" (1.842 m) (87 %*, Z = 1.14)  06/17/12 6' 0.44" (1.84 m) (88 %*, Z = 1.16)   * Growth percentiles are based on CDC 2-20 Years data.   Wt Readings from  Last 3 Encounters:  04/14/15 241 lb 9.6 oz (109.589 kg)  02/24/15 244 lb 12.8 oz (111.041 kg)  11/22/14 245 lb 11.2 oz (111.449 kg) (99 %*, Z = 2.32)   * Growth percentiles are based on CDC 2-20 Years data.   HC Readings from Last 3 Encounters:  No data found for [facility]   There is no height on file to calculate BSA. Normalized stature-for-age data available only for age 42 to 20 years. Normalized weight-for-age data available only for age 42 to 20 years.    PHYSICAL EXAM:  Constitutional: The patient appears healthy and well nourished. Tall.  Head: The head is normocephalic. Dreadlocks in place.  Face: The face appears normal. There are no obvious dysmorphic features. Has a mustache.  Eyes: The eyes appear to be normally formed and spaced. Gaze is conjugate. There is no obvious arcus or proptosis. Moisture appears normal. Ears: The ears are normally placed and appear externally normal. Mouth: The oropharynx and tongue appear normal. Dentition appears to be normal for age. Oral moisture is normal. Neck: The neck appears to be visibly normal, has acanthosis on posterior.  Lungs: The lungs are clear to auscultation. Air movement is good. Heart: Heart rate and rhythm are regular. Heart sounds S1 and S2 are normal. I did not appreciate any pathologic cardiac murmurs. Hands: There is no obvious tremor. Phalangeal and metacarpophalangeal joints are normal. Palmar muscles are normal for age. Palmar skin is normal. Palmar moisture is also normal.   LAB DATA:  Results for orders placed or performed in visit on 04/14/15  POCT Glucose (CBG)  Result Value Ref Range   POC Glucose 114 (A) 70 - 99 mg/dl       Assessment and Plan:  Assessment ASSESSMENT:  1. Type 2 diabetes: HbA1c 9.4 on 10/20. Patient has stopped Lantus and still happens to have fairly good control but inconsistent checking of BG.  2. Morbid obesity: weight is stable. He has loss some weight on own. May benefit from classes with  nutritionist on healthy snacking and late night eating.  3. Acanthosis: This problem is static.  4. Goiter:stable 5. Hypoglycemia: He has had subjective symptoms of low BG in the past month that come during work when he does not eat prior  and he does not check at work. Filled out dexcom paperwork today.   6. BP is appropriate today 7. Patient has had some cough and cold symptoms. Mother concerned about medication OTC with sugar in it. Also has a history of allergies but not on medication, no signs of acute infection on exam. May benefit for allergic medication.   PLAN:  1. Diagnostic: A1C as above. Annual labs over due. Mother states patient will drive to get annual labs today. Will look for paper work to approve driver's licence.  - T4, free - T3, free - TSH - Hemoglobin A1C - COMPLETE METABOLIC PANEL WITH GFR - C-peptide - Microalbumin / creatinine urine ratio - CBC With Differential - Vitamin B12 - Lipid panel 2. Therapeutic: Will discontinue Lantus since patient is not taking and seems well controlled. Continue metformin, 1000 mg, twice a day. Continue Invokana daily but will increase to 300 mg. Also put in a nutritionist referral so that patient and mother can meet with them.   3. Patient education: Reviewed BG printout. Discussed BG goals, weight loss goals, and dietary changes. Patient states he has already received his flu shot for this year. 4. Discussed with mother and patient about Dexcom CMG and how it would be beneficial due to infrequent BG checks. Patient and mother seemed very interested and wanted to move forward with plan. Started paperwork on today.  5. Discussed can treat patient with zyrtec, flonase or vicks vapor rubs. Suggested to look for cough medications that were sugar free or low in sugar.  6. Follow-up: 1 month    Level of Service: This visit lasted in excess of 25 minutes. More than 50% of the visit was devoted to counseling.    Hacker,Caroline T, FNP-C

## 2015-04-14 NOTE — Patient Instructions (Signed)
Please go to the lab to get your annual labs. We will call you with the results. Please increase to 3 tablets of your Invokana. We will schedule you to see a nutritionist. We are glad you chose to do a Dexcom, the continuous glucose monitor. We will work on your paper work today and will call you with the next steps.

## 2015-04-15 LAB — LIPID PANEL
CHOLESTEROL: 146 mg/dL (ref 125–170)
HDL: 49 mg/dL (ref >=40)
LDL Cholesterol: 88 mg/dL (ref <110)
TRIGLYCERIDES: 47 mg/dL (ref <150)
Total CHOL/HDL Ratio: 3 Ratio (ref <=5.0)
VLDL: 9 mg/dL (ref <30)

## 2015-04-15 LAB — C-PEPTIDE: C-Peptide: 1.36 ng/mL (ref 0.80–3.90)

## 2015-04-15 LAB — COMPLETE METABOLIC PANEL WITH GFR
ALT: 16 U/L (ref 9–46)
AST: 18 U/L (ref 10–40)
Albumin: 4.1 g/dL (ref 3.6–5.1)
Alkaline Phosphatase: 54 U/L (ref 40–115)
BUN: 17 mg/dL (ref 7–25)
CALCIUM: 9.6 mg/dL (ref 8.6–10.3)
CHLORIDE: 101 mmol/L (ref 98–110)
CO2: 27 mmol/L (ref 20–31)
CREATININE: 0.83 mg/dL (ref 0.60–1.35)
GFR, Est African American: >89 mL/min (ref >=60)
GFR, Est Non African American: >89 mL/min (ref >=60)
Glucose, Bld: 106 mg/dL — ABNORMAL HIGH (ref 70–99)
Potassium: 4.4 mmol/L (ref 3.5–5.3)
Sodium: 140 mmol/L (ref 135–146)
TOTAL PROTEIN: 6.5 g/dL (ref 6.1–8.1)
Total Bilirubin: 1.2 mg/dL (ref 0.2–1.2)

## 2015-04-15 LAB — MICROALBUMIN / CREATININE URINE RATIO: Creatinine, Urine: 108 mg/dL (ref 20–370)

## 2015-04-15 LAB — HEMOGLOBIN A1C
HEMOGLOBIN A1C: 7.3 % — AB (ref <5.7)
Mean Plasma Glucose: 163 mg/dL — ABNORMAL HIGH (ref <117)

## 2015-04-22 ENCOUNTER — Telehealth: Payer: Self-pay | Admitting: Pediatrics

## 2015-04-22 NOTE — Telephone Encounter (Signed)
Papers completed by provider. Left message advising they are @ the front desk to come pick up.

## 2015-05-05 ENCOUNTER — Other Ambulatory Visit: Payer: Self-pay | Admitting: Pediatric Endocrinology

## 2015-05-10 ENCOUNTER — Encounter: Payer: Self-pay | Admitting: *Deleted

## 2015-05-17 ENCOUNTER — Ambulatory Visit (INDEPENDENT_AMBULATORY_CARE_PROVIDER_SITE_OTHER): Payer: BLUE CROSS/BLUE SHIELD | Admitting: Pediatrics

## 2015-05-17 ENCOUNTER — Encounter: Payer: Self-pay | Admitting: Pediatrics

## 2015-05-17 VITALS — BP 117/67 | HR 64 | Wt 240.0 lb

## 2015-05-17 DIAGNOSIS — L83 Acanthosis nigricans: Secondary | ICD-10-CM

## 2015-05-17 DIAGNOSIS — E119 Type 2 diabetes mellitus without complications: Secondary | ICD-10-CM

## 2015-05-17 LAB — GLUCOSE, POCT (MANUAL RESULT ENTRY): POC GLUCOSE: 102 mg/dL — AB (ref 70–99)

## 2015-05-17 MED ORDER — CANAGLIFLOZIN 300 MG PO TABS: 300.0000 mg | ORAL_TABLET | Freq: Every day | ORAL | Status: DC

## 2015-05-17 MED ORDER — ACCU-CHEK FASTCLIX LANCETS MISC: Status: DC

## 2015-05-17 MED ORDER — GLUCOSE BLOOD VI STRP: ORAL_STRIP | Status: DC

## 2015-05-17 MED ORDER — METFORMIN HCL 1000 MG PO TABS: 1000.0000 mg | ORAL_TABLET | Freq: Two times a day (BID) | ORAL | Status: DC

## 2015-05-17 NOTE — Progress Notes (Signed)
Subjective:  Subjective Patient Name: Jared Potts Date of Birth: [birth year]  MRN: [id number]  Jared Potts  presents to the office today for follow-up evaluation and management of his type 2 diabetes, obesity, dyspepsia, schizophrenia, gynecomastia, hypertension, hyperlipidemia, thyroiditis, and noncompliance.  HISTORY OF PRESENT ILLNESS:   Jared Potts is a 20 y.o. African-American young man.    Jared Potts was accompanied by his mother.   1. On 05/11/09 the then 72 year old patient was admitted to the pediatric ward at [facility] for evaluation and treatment of new onset diabetes, mild-moderate diabetic ketoacidosis, dehydration, ketonuria, morbid obesity, acanthosis nigricans and gynecomastia in the setting of co-existent schizophrenia. Since his C-peptide was surprisingly low at 0.62 (normal 0.80-3.9) he was initially classified as having T1DM and was treated with Lantus as a basal insulin and Novolog aspart insulin, according to the 150/50/15 plan,  at mealtimes, bedtime, and 2:00 AM. Metformin was added to help with insulin resistance.     2. During his first year of having diabetes his BGs improved significantly. His HbA1c had decreased to 5.5% in July 2011, his C-peptide had increased to 8.45 in November 2011, and his insulin doses were progressively tapered. By September 2012 he had missed many doses of Lantus and Novolog, but his HbA1c had only increased to 6.1%. He had stopped taking insulin on his own by May 2013, but was still sometimes taking metformin. He subsequently stopped metformin as well. In October 2013 his C-peptide had increased to 2.58. He was started him on [name] in February 2014. By July his C-peptide had decreased to 1.08. At that time we discontinued [name] due to nausea and he was re-started him on his Lantus-Novolog plan. By February 2015, however, he had discontinued the Lantus and took the Novolog only sporadically. He was started on Bydureon that Spring,  but discontinued that drug soon after starting it due to the injections being painful. He was re-started on metformin and [name] in December 2015. He also took Lantus and Novolog infrequently. Last visit [name] was discontinued and he was started on Invokana. His HBA1c values in the past two years have varied from 5.8% to 10.3%.    3. The patient's last PSSG visit was on 04/14/15. In the interim, he has been generally healthy.   He got a new dog and he is waking up early. He continues on Invokana and metformin which is going well. He continues to work at [employer]. They contact dexcom but it was going to the more expensive than the family felt like they can afford. He has a problem with his toenails which he is interested in today.    4. Pertinent Review of Systems:  Constitutional: The patient feels "good/well".  Eyes: Vision seems to be good. There are no recognized eye problems. His last eye exam was about one year ago (October 2015). Neck: The patient has no complaints of anterior neck swelling, soreness, tenderness, pressure, discomfort, or difficulty swallowing.   Heart: Heart rate increases with exercise or other physical activity. The patient has no complaints of palpitations, irregular heart beats, chest pain, or chest pressure.   Gastrointestinal: As above. Bowel movents seem normal. The patient has no complaints of acid reflux, stomach aches or pains, diarrhea, or constipation.  Legs: Muscle mass and strength seem normal. There are no complaints of numbness, tingling, burning, or pain. No edema is noted.  Feet: Calluses have improved on feet with new work boots. Has fungal infection of 2 great toes and a few other toenails.  There are no other obvious foot problems. There are no complaints of numbness, tingling, burning, or pain. No edema is noted.  Neurologic: There are no recognized problems with muscle movement and strength, sensation, or coordination. Skin: State he has calluses on his  hand. Uses lotion, does not wear gloves (suggested thick moisturizer and to wear gloves when working).    BG printout: Checking 0.9 times per day. Avg BG 123 +/- 26. Range 85-178.   Last visit: BG printout: Checking an average of 1.1 times per day. 45 total readings. Avg BG 145 +/- 33. Range 145-218. Some days with all sugars in target. Other days with no sugars at all.   Annual Labs- December 2016   PAST MEDICAL, FAMILY, AND SOCIAL HISTORY  Past Medical History  Diagnosis Date  . Type 2 diabetes mellitus in patient age 37-19 years with HbA1C goal below 7.5   . Obesity, Class III, BMI 40-49.9 (morbid obesity) ([place])   . 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 Goiter   . Gynecomastia, male   . Hypertension   . Combined hyperlipidemia   . Attention deficit disorder   . Thyroiditis, autoimmune   . Noncompliance with treatment     Family History  Problem Relation Age of Onset  . Obesity Mother   . Diabetes Maternal Aunt   . Obesity Maternal Aunt   . Diabetes Maternal Grandmother   . Obesity Maternal Grandmother   . Cancer Neg Hx      Current outpatient prescriptions:  .  ARIPiprazole (ABILIFY) 20 MG tablet, Take 20 mg by mouth daily., Disp: , Rfl:  .  canagliflozin (INVOKANA) 100 MG TABS tablet, Take 3 tablets (300 mg total) by mouth daily., Disp: 90 tablet, Rfl: 3 .  Cariprazine HCl 3 MG CAPS, Take by mouth., Disp: , Rfl:  .  dexmethylphenidate (FOCALIN XR) 20 MG 24 hr capsule, Take 20 mg by mouth daily., Disp: , Rfl:  .  Insulin Pen Needle 31G X 6 MM MISC, For use with [name] Pen Device. 1 Injection per day., Disp: 30 each, Rfl: 3 .  metFORMIN (GLUCOPHAGE) 1000 MG tablet, TAKE ONE TABLET BY MOUTH TWICE DAILY WITH A MEAL., Disp: 60 tablet, Rfl: 0 .  ONETOUCH VERIO test strip, USE TO CHECK BLOOD SUGAR 6 TIMES DAILY, Disp: 200 each, Rfl: 6  Allergies as of 05/17/2015  . (No Known Allergies)     reports that he has never smoked. He has never used smokeless tobacco. He reports that he  does not drink alcohol or use illicit drugs. Pediatric History  Patient Guardian Status  . Mother:  Jared Potts,Jared Potts   Other Topics Concern  . Not on file   Social History Narrative   Lives with mom and dad and brother.   He attends [employer] with goal of working in [employer]. He only works at [employer] now, previously working at camp as well. Activities: Walking at work. Primary Care Provider: Maggie Font, MD  Psychiatry: Dr. Milana Huntsman  REVIEW OF SYSTEMS: There are no other significant problems involving Jared Pottss other body systems.    Objective:  Objective Vital Signs:  BP 117/67 mmHg  Pulse 64  Wt 240 lb (108.863 kg)   Ht Readings from Last 3 Encounters:  12/30/12 6' 0.44" (1.84 m) (87 %*, Z = 1.11)  11/17/12 6' 0.5" (1.842 m) (87 %*, Z = 1.14)  06/17/12 6' 0.44" (1.84 m) (88 %*, Z = 1.16)   * Growth percentiles are based on CDC 2-20 Years data.  Wt Readings from Last 3 Encounters:  05/17/15 240 lb (108.863 kg)  04/14/15 241 lb 9.6 oz (109.589 kg)  02/24/15 244 lb 12.8 oz (111.041 kg)   HC Readings from Last 3 Encounters:  No data found for HC   There is no height on file to calculate BSA. Normalized stature-for-age data available only for age 69 to 26 years. Normalized weight-for-age data available only for age 69 to 20 years.    PHYSICAL EXAM:  Constitutional: The patient appears healthy and well nourished. Tall.  Head: The head is normocephalic. Dreadlocks in place.  Face: The face appears normal. There are no obvious dysmorphic features. Has a mustache.  Eyes: The eyes appear to be normally formed and spaced. Gaze is conjugate. There is no obvious arcus or proptosis. Moisture appears normal. Ears: The ears are normally placed and appear externally normal. Mouth: The oropharynx and tongue appear normal. Dentition appears to be normal for age. Oral moisture is normal. Neck: The neck appears to be visibly normal, has acanthosis on posterior.  Lungs:  The lungs are clear to auscultation. Air movement is good. Heart: Heart rate and rhythm are regular. Heart sounds S1 and S2 are normal. I did not appreciate any pathologic cardiac murmurs. Hands: There is no obvious tremor. Phalangeal and metacarpophalangeal joints are normal. Palmar muscles are normal for age. Palmar skin is normal. Palmar moisture is also normal.   LAB DATA:  Office Visit on 05/17/2015  Component Date Value Ref Range Status  . POC Glucose 05/17/2015 102* 70 - 99 mg/dl Final   6am apple and ham sanwich   Office Visit on 04/14/2015  Component Date Value Ref Range Status  . POC Glucose 04/14/2015 114* 70 - 99 mg/dl Final   12/7 @ 2300 chicken, ravioli  . Free T4 04/14/2015 1.11  0.80 - 1.80 ng/dL Final  . T3, Free 04/14/2015 3.2  2.3 - 4.2 pg/mL Final  . TSH 04/14/2015 1.225  0.350 - 4.500 uIU/mL Final  . Hgb A1c MFr Bld 04/14/2015 7.3* <5.7 % Final   Comment:                                                                        According to the ADA Clinical Practice Recommendations for 2011, when HbA1c is used as a screening test:     >=6.5%   Diagnostic of Diabetes Mellitus            (if abnormal result is confirmed)   5.7-6.4%   Increased risk of developing Diabetes Mellitus   References:Diagnosis and Classification of Diabetes Mellitus,Diabetes TFTD,3220,25(KYHCW 1):S62-S69 and Standards of Medical Care in         Diabetes - 2011,Diabetes Care,2011,34 (Suppl 1):S11-S61.     . Mean Plasma Glucose 04/14/2015 163* <117 mg/dL Final  . Sodium 04/14/2015 140  135 - 146 mmol/L Final  . Potassium 04/14/2015 4.4  3.5 - 5.3 mmol/L Final  . Chloride 04/14/2015 101  98 - 110 mmol/L Final  . CO2 04/14/2015 27  20 - 31 mmol/L Final  . Glucose, Bld 04/14/2015 106* 70 - 99 mg/dL Final  . BUN 04/14/2015 17  7 - 25 mg/dL Final  . Creat 04/14/2015 0.83  0.60 - 1.35 mg/dL Final  .  Total Bilirubin 04/14/2015 1.2  0.2 - 1.2 mg/dL Final  . Alkaline Phosphatase 04/14/2015 54   40 - 115 U/L Final  . AST 04/14/2015 18  10 - 40 U/L Final  . ALT 04/14/2015 16  9 - 46 U/L Final  . Total Protein 04/14/2015 6.5  6.1 - 8.1 g/dL Final  . Albumin 04/14/2015 4.1  3.6 - 5.1 g/dL Final  . Calcium 04/14/2015 9.6  8.6 - 10.3 mg/dL Final  . GFR, Est African American 04/14/2015 >89  >=60 mL/min Final  . GFR, Est Non African American 04/14/2015 >89  >=60 mL/min Final   Comment:   The estimated GFR is a calculation valid for adults (>=12 years old) that uses the CKD-EPI algorithm to adjust for age and sex. It is   not to be used for children, pregnant women, hospitalized patients,    patients on dialysis, or with rapidly changing kidney function. According to the NKDEP, eGFR >89 is normal, 60-89 shows mild impairment, 30-59 shows moderate impairment, 15-29 shows severe impairment and <15 is ESRD.     Marland Kitchen C-Peptide 04/14/2015 1.36  0.80 - 3.90 ng/mL Final  . Creatinine, Urine 04/14/2015 108  20 - 370 mg/dL Final  . Microalb, Ur 04/14/2015 <0.2  Not estab mg/dL Final  . Microalb Creat Ratio 04/14/2015 SEE NOTE  <30 mcg/mg creat Final   Comment: The microalbumin value is less than 0.2 mg/dL therefore we are unable to calculate excretion and/or creatinine ratio. The ADA has defined abnormalities in albumin excretion as follows:           Category           Result                            (mcg/mg creatinine)                 Normal:    <30       Microalbuminuria:    30 - 299   Clinical albuminuria:    > or = 300   The ADA recommends that at least two of three specimens collected within a 3 - 6 month period be abnormal before considering a patient to be within a diagnostic category.     . Vitamin B-12 04/14/2015 433  211 - 911 pg/mL Final  . Cholesterol 04/14/2015 146  125 - 170 mg/dL Final  . Triglycerides 04/14/2015 47  <150 mg/dL Final  . HDL 04/14/2015 49  >=40 mg/dL Final  . Total CHOL/HDL Ratio 04/14/2015 3.0  <=5.0 Ratio Final  . VLDL 04/14/2015 9  <30 mg/dL Final   . LDL Cholesterol 04/14/2015 88  <110 mg/dL Final   Comment:   Total Cholesterol/HDL Ratio:CHD Risk                        Coronary Heart Disease Risk Table                                        Men       Women          1/2 Average Risk              3.4        3.3              Average Risk  5.0        4.4           2X Average Risk              9.6        7.1           3X Average Risk             23.4       11.0 Use the calculated Patient Ratio above and the CHD Risk table  to determine the patient's CHD Risk.   Office Visit on 02/24/2015  Component Date Value Ref Range Status  . POC Glucose 02/24/2015 263* 70 - 99 mg/dl Final   12pm  half a bag of chips   . Hemoglobin A1C 02/24/2015 9.4   Final    Results for orders placed or performed in visit on 05/17/15  POCT Glucose (CBG)  Result Value Ref Range   POC Glucose 102 (A) 70 - 99 mg/dl       Assessment and Plan:  Assessment ASSESSMENT:  1. Type 2 diabetes: A1C improved to 7.3 as above with addition of invokana. Continues off insulin  2. Morbid obesity: weight is stable. He has loss some weight on own. May benefit from classes with nutritionist on healthy snacking and late night eating.  3. Acanthosis: This problem is static.  4. Goiter:stable 5. Hypoglycemia: None. Does not wish to pursue dexcom d/t cost    6. BP is appropriate today  PLAN:  1. Diagnostic: A1C as above. Annual labs as above  2. Therapeutic: Continue metformin, 1000 mg, twice a day. Continue Invokana 300 mg.  3. Patient education: Reviewed BG printout. Discussed BG goals, weight loss goals, and dietary changes. Discussed fungal infection of toenails and seeking assistance from PCP and likely podiatrist to clear.  6. Follow-up: 3 months     Level of Service: This visit lasted in excess of 25 minutes. More than 50% of the visit was devoted to counseling.    Krystian Ferrentino T, FNP-C

## 2015-08-22 ENCOUNTER — Encounter: Payer: Self-pay | Admitting: *Deleted

## 2015-08-23 ENCOUNTER — Ambulatory Visit: Payer: BLUE CROSS/BLUE SHIELD | Admitting: Pediatrics

## 2015-08-25 ENCOUNTER — Encounter: Payer: Self-pay | Admitting: *Deleted

## 2015-08-25 ENCOUNTER — Encounter: Payer: Self-pay | Admitting: Pediatrics

## 2015-08-25 ENCOUNTER — Ambulatory Visit (INDEPENDENT_AMBULATORY_CARE_PROVIDER_SITE_OTHER): Payer: BLUE CROSS/BLUE SHIELD | Admitting: Pediatrics

## 2015-08-25 VITALS — BP 129/79 | HR 80 | Wt 244.6 lb

## 2015-08-25 DIAGNOSIS — E119 Type 2 diabetes mellitus without complications: Secondary | ICD-10-CM

## 2015-08-25 DIAGNOSIS — E1165 Type 2 diabetes mellitus with hyperglycemia: Secondary | ICD-10-CM | POA: Diagnosis not present

## 2015-08-25 DIAGNOSIS — L83 Acanthosis nigricans: Secondary | ICD-10-CM | POA: Diagnosis not present

## 2015-08-25 DIAGNOSIS — E669 Obesity, unspecified: Secondary | ICD-10-CM

## 2015-08-25 DIAGNOSIS — IMO0001 Reserved for inherently not codable concepts without codable children: Secondary | ICD-10-CM

## 2015-08-25 DIAGNOSIS — F845 Asperger's syndrome: Secondary | ICD-10-CM

## 2015-08-25 LAB — GLUCOSE, POCT (MANUAL RESULT ENTRY): POC Glucose: 135 mg/dl — AB (ref 70–99)

## 2015-08-25 LAB — POCT GLYCOSYLATED HEMOGLOBIN (HGB A1C): HEMOGLOBIN A1C: 6

## 2015-08-25 MED ORDER — CANAGLIFLOZIN 300 MG PO TABS: 300.0000 mg | ORAL_TABLET | Freq: Every day | ORAL | Status: DC

## 2015-08-25 MED ORDER — METFORMIN HCL 1000 MG PO TABS: 1000.0000 mg | ORAL_TABLET | Freq: Two times a day (BID) | ORAL | Status: DC

## 2015-08-25 NOTE — Patient Instructions (Addendum)
Continue metformin and invokana  We will continue to follow his thyroid labs over time

## 2015-08-25 NOTE — Progress Notes (Signed)
Subjective:  Subjective Patient Name: Jared Potts Date of Birth: [birth year]  MRN: [id number]  Jared Potts  presents to the office today for follow-up evaluation and management of his type 2 diabetes, obesity, dyspepsia, schizophrenia, gynecomastia, hypertension, hyperlipidemia, thyroiditis, and noncompliance.  HISTORY OF PRESENT ILLNESS:   Jared Potts is a 20 y.o. African-American young man.    Jared Potts was accompanied by his mother.   1. On 05/11/09 the then 10 year old patient was admitted to the pediatric ward at [facility] for evaluation and treatment of new onset diabetes, mild-moderate diabetic ketoacidosis, dehydration, ketonuria, morbid obesity, acanthosis nigricans and gynecomastia in the setting of co-existent schizophrenia. Since his C-peptide was surprisingly low at 0.62 (normal 0.80-3.9) he was initially classified as having T1DM and was treated with Lantus as a basal insulin and Novolog aspart insulin, according to the 150/50/15 plan,  at mealtimes, bedtime, and 2:00 AM. Metformin was added to help with insulin resistance.     2. During his first year of having diabetes his BGs improved significantly. His HbA1c had decreased to 5.5% in July 2011, his C-peptide had increased to 8.45 in November 2011, and his insulin doses were progressively tapered. By September 2012 he had missed many doses of Lantus and Novolog, but his HbA1c had only increased to 6.1%. He had stopped taking insulin on his own by May 2013, but was still sometimes taking metformin. He subsequently stopped metformin as well. In October 2013 his C-peptide had increased to 2.58. He was started him on [name] in February 2014. By July his C-peptide had decreased to 1.08. At that time we discontinued [name] due to nausea and he was re-started him on his Lantus-Novolog plan. By February 2015, however, he had discontinued the Lantus and took the Novolog only sporadically. He was started on Bydureon that Spring,  but discontinued that drug soon after starting it due to the injections being painful. He was re-started on metformin and [name] in December 2015. He also took Lantus and Novolog infrequently. Last visit [name] was discontinued and he was started on Invokana. His HBA1c values in the past two years have varied from 5.8% to 10.3%.    3. The patient's last PSSG visit was on 05/17/15. In the interim, he has been generally healthy.   He has been eating more when he comes home from work hungry. He has been taking his medications regularly but has not been checking his sugars. He switched to a new psychiatric medication due to his insurance not covering his other one so mom is concerned that his sugars may rise. He did not get his toenails looked at by a podiatrist but is worried the fungal infection will get into his blood stream. Overall, things are going very well.    4. Pertinent Review of Systems:  Constitutional: The patient feels "good/well".  Eyes: Vision seems to be good. There are no recognized eye problems. His last eye exam was about one year ago (October 2015). Neck: The patient has no complaints of anterior neck swelling, soreness, tenderness, pressure, discomfort, or difficulty swallowing.   Heart: Heart rate increases with exercise or other physical activity. The patient has no complaints of palpitations, irregular heart beats, chest pain, or chest pressure.   Gastrointestinal: As above. Bowel movents seem normal. The patient has no complaints of acid reflux, stomach aches or pains, diarrhea, or constipation.  Legs: Muscle mass and strength seem normal. There are no complaints of numbness, tingling, burning, or pain. No edema is noted.  Feet:  Calluses have improved on feet with new work boots. Has fungal infection of 2 great toes and a few other toenails. There are no other obvious foot problems. There are no complaints of numbness, tingling, burning, or pain. No edema is noted.   Neurologic: There are no recognized problems with muscle movement and strength, sensation, or coordination. Skin: calluses have improve    Last visit BG printout: Checking 0.9 times per day. Avg BG 123 +/- 26. Range 85-178.   Annual Labs- December 2016   PAST MEDICAL, FAMILY, AND SOCIAL HISTORY  Past Medical History  Diagnosis Date  . Type 2 diabetes mellitus in patient age 21-19 years with HbA1C goal below 7.5   . Obesity, Class III, BMI 40-49.9 (morbid obesity) ([facility])   . 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 Goiter   . Gynecomastia, male   . Hypertension   . Combined hyperlipidemia   . Attention deficit disorder   . Thyroiditis, autoimmune   . Noncompliance with treatment     Family History  Problem Relation Age of Onset  . Obesity Mother   . Diabetes Maternal Aunt   . Obesity Maternal Aunt   . Diabetes Maternal Grandmother   . Obesity Maternal Grandmother   . Cancer Neg Hx      Current outpatient prescriptions:  .  ACCU-CHEK FASTCLIX LANCETS MISC, Check sugar 6 x daily, Disp: 204 each, Rfl: 3 .  Brexpiprazole (REXULTI PO), Take 10 mg by mouth., Disp: , Rfl:  .  canagliflozin (INVOKANA) 300 MG TABS tablet, Take 1 tablet (300 mg total) by mouth daily before breakfast., Disp: 30 tablet, Rfl: 6 .  dexmethylphenidate (FOCALIN XR) 20 MG 24 hr capsule, Take 20 mg by mouth daily., Disp: , Rfl:  .  glucose blood (ONETOUCH VERIO) test strip, USE TO CHECK BLOOD SUGAR 6 TIMES DAILY, Disp: 200 each, Rfl: 6 .  metFORMIN (GLUCOPHAGE) 1000 MG tablet, Take 1 tablet (1,000 mg total) by mouth 2 (two) times daily with a meal., Disp: 60 tablet, Rfl: 6  Allergies as of 08/25/2015  . (No Known Allergies)     reports that he has never smoked. He has never used smokeless tobacco. He reports that he does not drink alcohol or use illicit drugs. Pediatric History  Patient Guardian Status  . Mother:  Jared Potts,Jared Potts   Other Topics Concern  . Not on file   Social History Narrative   Lives with  mom and dad and brother.   He attends [employer] with goal of working in [employer] technology. He only works at [employer] now. Activities: Walking at work. Primary Care Provider: Evlyn CourierGerald K Hill, MD  Psychiatry: Dr. Beverly MilchGlenn Jennings  REVIEW OF SYSTEMS: There are no other significant problems involving Damond's other body systems.    Objective:  Objective Vital Signs:  BP 129/79 mmHg  Pulse 80  Wt 244 lb 9.6 oz (110.95 kg)   Ht Readings from Last 3 Encounters:  12/30/12 6' 0.44" (1.84 m) (87 %*, Z = 1.11)  11/17/12 6' 0.5" (1.842 m) (87 %*, Z = 1.14)  06/17/12 6' 0.44" (1.84 m) (88 %*, Z = 1.16)   * Growth percentiles are based on CDC 2-20 Years data.   Wt Readings from Last 3 Encounters:  08/25/15 244 lb 9.6 oz (110.95 kg)  05/17/15 240 lb (108.863 kg)  04/14/15 241 lb 9.6 oz (109.589 kg)   HC Readings from Last 3 Encounters:  No data found for HC   There is no height on file to calculate  BSA. Facility age limit for growth percentiles is 20 years. Facility age limit for growth percentiles is 20 years.    PHYSICAL EXAM:  Constitutional: The patient appears healthy and well nourished. Tall.  Head: The head is normocephalic. Dreadlocks in place.  Face: The face appears normal. There are no obvious dysmorphic features. Has a mustache.  Eyes: The eyes appear to be normally formed and spaced. Gaze is conjugate. There is no obvious arcus or proptosis. Moisture appears normal. Ears: The ears are normally placed and appear externally normal. Mouth: The oropharynx and tongue appear normal. Dentition appears to be normal for age. Oral moisture is normal. Neck: The neck appears to be visibly normal, has acanthosis on posterior.  Lungs: The lungs are clear to auscultation. Air movement is good. Heart: Heart rate and rhythm are regular. Heart sounds S1 and S2 are normal. I did not appreciate any pathologic cardiac murmurs. Hands: There is no obvious tremor. Phalangeal and metacarpophalangeal  joints are normal. Palmar muscles are normal for age. Palmar skin is normal. Palmar moisture is also normal.   LAB DATA:   Results for orders placed or performed in visit on 08/25/15  POCT Glucose (CBG)  Result Value Ref Range   POC Glucose 135 (A) 70 - 99 mg/dl  POCT HgB R6E  Result Value Ref Range   Hemoglobin A1C 6.0        Assessment and Plan:  Assessment ASSESSMENT:  1. Type 2 diabetes: A1C improved to 6.0 as above with addition of invokana. Continues off insulin  2. Morbid obesity: weight is stable.  3. Acanthosis: This problem is static.  4. Goiter:stable. Last thyroid labs in December normal.  5. Hypoglycemia: None.  6. BP is appropriate today  PLAN:  1. Diagnostic: A1C as above.   2. Therapeutic: Continue metformin, 1000 mg, twice a day. Continue Invokana 300 mg.  3. Patient education: discussed success with oral medications. Discussed changes to psychiatric meds and watching glucoses. He signed up for mychart today.  Discussed fungal infection of toenails and seeking assistance from PCP and likely podiatrist to clear.  6. Follow-up: 3 months     Level of Service: This visit lasted in excess of 25 minutes. More than 50% of the visit was devoted to counseling.    Matheus Spiker T, FNP-C

## 2015-09-28 ENCOUNTER — Other Ambulatory Visit: Payer: Self-pay | Admitting: Pediatrics

## 2015-09-28 MED ORDER — DAPAGLIFLOZIN PROPANEDIOL 5 MG PO TABS: 5.0000 mg | ORAL_TABLET | Freq: Every day | ORAL | Status: DC

## 2015-09-28 NOTE — Progress Notes (Signed)
Invokana changed to [place] due to coverage with insurance formulary. Will start at lowest farxiga dose due to current good control of glucose and titrate up if needed.

## 2015-09-29 NOTE — Progress Notes (Signed)
LVM, advised that per Alfonso Ramusaroline Hacker FNP,  I have changed meds. They are in the same category. He can start the farxiga once he runs out of the invokana. They shouldn't be taken together. We will monitor his A1C and increase the farxiga dose if needed.

## 2015-11-29 ENCOUNTER — Ambulatory Visit: Payer: BLUE CROSS/BLUE SHIELD | Admitting: Pediatrics

## 2015-12-02 ENCOUNTER — Ambulatory Visit: Payer: Self-pay

## 2015-12-02 ENCOUNTER — Other Ambulatory Visit: Payer: Self-pay | Admitting: Occupational Medicine

## 2015-12-02 DIAGNOSIS — M79645 Pain in left finger(s): Secondary | ICD-10-CM

## 2015-12-29 ENCOUNTER — Telehealth: Payer: Self-pay | Admitting: Pediatrics

## 2015-12-29 NOTE — Telephone Encounter (Signed)
I definitely remember completing these papers but it appears they didn't get scanned in before they went back. Please have them bring another set and I will complete ASAP and [facility].

## 2015-12-29 NOTE — Telephone Encounter (Signed)
Returned TC to mom to advise to bring in new DMV form for provider to complete. Mom ok with information given.

## 2015-12-29 NOTE — Telephone Encounter (Signed)
Routed to [place].

## 2015-12-29 NOTE — Telephone Encounter (Signed)
Stated we sent DMV papers for him earlier this year, but the DMV stated they never received them. Patient's license has been suspended.

## 2016-01-03 ENCOUNTER — Telehealth: Payer: Self-pay | Admitting: Pediatrics

## 2016-01-03 ENCOUNTER — Encounter: Payer: Self-pay | Admitting: Pediatrics

## 2016-01-03 ENCOUNTER — Ambulatory Visit (INDEPENDENT_AMBULATORY_CARE_PROVIDER_SITE_OTHER): Payer: BLUE CROSS/BLUE SHIELD | Admitting: Pediatrics

## 2016-01-03 VITALS — BP 110/78 | HR 66 | Wt 271.6 lb

## 2016-01-03 DIAGNOSIS — E1100 Type 2 diabetes mellitus with hyperosmolarity without nonketotic hyperglycemic-hyperosmolar coma (NKHHC): Secondary | ICD-10-CM | POA: Diagnosis not present

## 2016-01-03 LAB — GLUCOSE, POCT (MANUAL RESULT ENTRY): POC GLUCOSE: 182 mg/dL — AB (ref 70–99)

## 2016-01-03 LAB — POCT GLYCOSYLATED HEMOGLOBIN (HGB A1C): HEMOGLOBIN A1C: 6.7

## 2016-01-03 MED ORDER — DAPAGLIFLOZIN PROPANEDIOL 10 MG PO TABS: 10.0000 mg | ORAL_TABLET | Freq: Every day | ORAL | 6 refills | Status: DC

## 2016-01-03 NOTE — Patient Instructions (Addendum)
Increase Farxiga to 10 mg daily  Stop drinking powerade and soda!! Try gatorade G2 Go see primary care for your toenails- you likely need an oral medication to help

## 2016-01-03 NOTE — Telephone Encounter (Signed)
DMV papers have been located, copied, sent to batch scan, and a copy was given to pt. [facility]

## 2016-01-03 NOTE — Progress Notes (Signed)
Subjective:  Subjective  Patient Name: Jared Potts Date of Birth: 1996-03-26  MRN: [id number]  Jared Potts  presents to the office today for follow-up evaluation and management of his type 2 diabetes, obesity, dyspepsia, schizophrenia, gynecomastia, hypertension, hyperlipidemia, thyroiditis, and noncompliance.  HISTORY OF PRESENT ILLNESS:   Jared Potts is a 20 y.o. African-American young man.    Jared Potts was accompanied by his mother.   1. On 05/11/09 the then 20 year old patient was admitted to the pediatric ward at [facility] for evaluation and treatment of new onset diabetes, mild-moderate diabetic ketoacidosis, dehydration, ketonuria, morbid obesity, acanthosis nigricans and gynecomastia in the setting of co-existent schizophrenia. Since his C-peptide was surprisingly low at 0.62 (normal 0.80-3.9) he was initially classified as having T1DM and was treated with Lantus as a basal insulin and Novolog aspart insulin, according to the 150/50/15 plan,  at mealtimes, bedtime, and 2:00 AM. Metformin was added to help with insulin resistance.     2. During his first year of having diabetes his BGs improved significantly. His HbA1c had decreased to 5.5% in July 2011, his C-peptide had increased to 8.45 in November 2011, and his insulin doses were progressively tapered. By September 2012 he had missed many doses of Lantus and Novolog, but his HbA1c had only increased to 6.1%. He had stopped taking insulin on his own by May 2013, but was still sometimes taking metformin. He subsequently stopped metformin as well. In October 2013 his C-peptide had increased to 2.58. He was started him on [name] in February 2014. By July his C-peptide had decreased to 1.08. At that time we discontinued [name] due to nausea and he was re-started him on his Lantus-Novolog plan. By February 2015, however, he had discontinued the Lantus and took the Novolog only sporadically. He was started on Bydureon that Spring,  but discontinued that drug soon after starting it due to the injections being painful. He was re-started on metformin and [name] in December 2015. He also took Lantus and Novolog infrequently. Last visit [name] was discontinued and he was started on Invokana. His HBA1c values in the past two years have varied from 5.8% to 10.3%.    3. The patient's last [facility] visit was on 08/25/15. In the interim, he has been generally healthy.   His license got suspended because the DMV didn't get his paperwork. He is very tired from working two jobs. He gained some weight- he doesn't feel like he has been eating that much but he has been eating fast food more often. He is drinking a lot of powerade at work. Some soda. He continues to have calluses on his feet and toenails that are all black on one foot.     4. Pertinent Review of Systems:  Constitutional: The patient feels "good/well".  Eyes: Vision seems to be good. There are no recognized eye problems. His last eye exam was about one year ago (October 2015). Neck: The patient has no complaints of anterior neck swelling, soreness, tenderness, pressure, discomfort, or difficulty swallowing.   Heart: Heart rate increases with exercise or other physical activity. The patient has no complaints of palpitations, irregular heart beats, chest pain, or chest pressure.   Gastrointestinal: As above. Bowel movents seem normal. The patient has no complaints of acid reflux, stomach aches or pains, diarrhea, or constipation.  Legs: Muscle mass and strength seem normal. There are no complaints of numbness, tingling, burning, or pain. No edema is noted.  Feet: Fungal infection of all toes on one foot,  pinky toe on the other. Calluses bilaterally. There are no other obvious foot problems. There are no complaints of numbness, tingling, burning, or pain. No edema is noted.  Neurologic: There are no recognized problems with muscle movement and strength, sensation, or  coordination.  BG printout: Not checking sugars.  Last visit BG printout: Checking 0.9 times per day. Avg BG 123 +/- 26. Range 85-178.   Annual Labs- December 2016   PAST MEDICAL, FAMILY, AND SOCIAL HISTORY  Past Medical History:  Diagnosis Date  . Attention deficit disorder   . Combined hyperlipidemia   . Goiter   . Gynecomastia, male   . Hypertension   . Noncompliance with treatment   . Obesity, Class III, BMI 40-49.9 (morbid obesity) (HCC)   . 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 Thyroiditis, autoimmune   . Type 2 diabetes mellitus in patient age 60-19 years with HbA1C goal below 7.5     Family History  Problem Relation Age of Onset  . Obesity Mother   . Diabetes Maternal Aunt   . Obesity Maternal Aunt   . Diabetes Maternal Grandmother   . Obesity Maternal Grandmother   . Cancer Neg Hx      Current Outpatient Prescriptions:  .  ACCU-CHEK FASTCLIX LANCETS MISC, Check sugar 6 x daily, Disp: 204 each, Rfl: 3 .  dexmethylphenidate (FOCALIN XR) 20 MG 24 hr capsule, Take 20 mg by mouth daily., Disp: , Rfl:  .  glucose blood (ONETOUCH VERIO) test strip, USE TO CHECK BLOOD SUGAR 6 TIMES DAILY, Disp: 200 each, Rfl: 6 .  metFORMIN (GLUCOPHAGE) 1000 MG tablet, Take 1 tablet (1,000 mg total) by mouth 2 (two) times daily with a meal., Disp: 60 tablet, Rfl: 6 .  Brexpiprazole (REXULTI PO), Take 10 mg by mouth., Disp: , Rfl:  .  dapagliflozin propanediol (FARXIGA) 10 MG TABS tablet, Take 10 mg by mouth daily., Disp: 30 tablet, Rfl: 6  Allergies as of 01/03/2016  . (No Known Allergies)     reports that he has never smoked. He has never used smokeless tobacco. He reports that he does not drink alcohol or use drugs. Pediatric History  Patient Guardian Status  . Mother:  Skolnick,[name]   Other Topics Concern  . Not on file   Social History Narrative   Lives with mom and dad and brother.   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 Working at [employer] and [employer].   Activities: Walking at work. Primary Care Provider: Evlyn Courier, MD  Psychiatry: Dr. Beverly Milch  REVIEW OF SYSTEMS: There are no other significant problems involving Jared Potts's other body systems.    Objective:  Objective  Vital Signs:  BP 110/78   Pulse 66   Wt 271 lb 9.6 oz (123.2 kg)    Ht Readings from Last 3 Encounters:  12/30/12 6' 0.44" (1.84 m) (87 %, Z= 1.11)*  11/17/12 6' 0.5" (1.842 m) (87 %, Z= 1.14)*  06/17/12 6' 0.44" (1.84 m) (88 %, Z= 1.16)*   * Growth percentiles are based on CDC 2-20 Years data.   Wt Readings from Last 3 Encounters:  01/03/16 271 lb 9.6 oz (123.2 kg)  08/25/15 244 lb 9.6 oz (110.9 kg)  05/17/15 240 lb (108.9 kg)   HC Readings from Last 3 Encounters:  No data found for HC   There is no height or weight on file to calculate BSA. Facility age limit for growth percentiles is 20 years. Facility age limit for growth percentiles is  20 years.    PHYSICAL EXAM:  Constitutional: The patient appears healthy and well nourished. Tall.  Head: The head is normocephalic. Dreadlocks in place.  Face: The face appears normal. There are no obvious dysmorphic features. Has a mustache.  Eyes: The eyes appear to be normally formed and spaced. Gaze is conjugate. There is no obvious arcus or proptosis. Moisture appears normal. Ears: The ears are normally placed and appear externally normal. Mouth: The oropharynx and tongue appear normal. Dentition appears to be normal for age. Oral moisture is normal. Neck: The neck appears to be visibly normal, has acanthosis on posterior.  Lungs: The lungs are clear to auscultation. Air movement is good. Heart: Heart rate and rhythm are regular. Heart sounds S1 and S2 are normal. I did not appreciate any pathologic cardiac murmurs. Hands: There is no obvious tremor. Phalangeal and metacarpophalangeal joints are normal. Palmar muscles are normal for age. Palmar skin is normal. Palmar moisture is also normal.   LAB  DATA:   Results for orders placed or performed in visit on 01/03/16  POCT Glucose (CBG)  Result Value Ref Range   POC Glucose 182 (A) 70 - 99 mg/dl  POCT HgB [id number]  Result Value Ref Range   Hemoglobin A1C 6.7        Assessment and Plan:  Assessment  ASSESSMENT:  1. Type 2 diabetes: A1C has increased after change from invokana to farxiga and drinking more sugar with powerade. Overall stable  2. Morbid obesity: significant weight gain with increase in fast food and sugary beverages.   3. Acanthosis: This problem is static.  4. Goiter:stable. Last thyroid labs in December normal.  5. Hypoglycemia: None.  6. BP is appropriate today  PLAN:  1. Diagnostic: A1C as above.   2. Therapeutic: Continue metformin, 1000 mg, twice a day. Increase Farxiga to 10 mg daily from 5 mg. Stop drinking powerade.  3. Patient education: discussed success with oral medications. Again discussed fungal infections of toenails. Discussed DMV paperwork and [facility]. Filled out second page of form and [facility] and to family during visit.    6. Follow-up: 3 months     Level of Service: This visit lasted in excess of 25 minutes. More than 50% of the visit was devoted to counseling.    Hacker,Caroline T, FNP-C

## 2016-01-31 ENCOUNTER — Encounter (HOSPITAL_COMMUNITY): Payer: Self-pay | Admitting: Emergency Medicine

## 2016-01-31 ENCOUNTER — Ambulatory Visit (HOSPITAL_COMMUNITY)
Admission: EM | Admit: 2016-01-31 | Discharge: 2016-01-31 | Disposition: A | Discharge status: Home / Self Care | Payer: BLUE CROSS/BLUE SHIELD | Attending: Family Medicine | Admitting: Family Medicine

## 2016-01-31 DIAGNOSIS — H109 Unspecified conjunctivitis: Secondary | ICD-10-CM

## 2016-01-31 MED ORDER — POLYMYXIN B-TRIMETHOPRIM 10000-0.1 UNIT/ML-% OP SOLN: 2.0000 [drp] | OPHTHALMIC | 0 refills | Status: AC

## 2016-01-31 NOTE — ED Provider Notes (Signed)
CSN: [id number]     Arrival date & time 01/31/16  1746 History   First MD Initiated Contact with Patient 01/31/16 1944     Chief Complaint  Patient presents with  . Eye Problem   (Consider location/radiation/quality/duration/timing/severity/associated sxs/prior Treatment) HPI 20 y/o male with b/l pink eye x 2 day Itchy, drainage gritty. No known exposure.  Past Medical History:  Diagnosis Date  . Attention deficit disorder   . Combined hyperlipidemia   . Goiter   . Gynecomastia, male   . Hypertension   . Noncompliance with treatment   . Obesity, Class III, BMI 40-49.9 (morbid obesity) (HCC)   . 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 Thyroiditis, autoimmune   . Type 2 diabetes mellitus in patient age 22-19 years with HbA1C goal below 7.5    Past Surgical History:  Procedure Laterality Date  . CIRCUMCISION     Family History  Problem Relation Age of Onset  . Obesity Mother   . Diabetes Maternal Aunt   . Obesity Maternal Aunt   . Diabetes Maternal Grandmother   . Obesity Maternal Grandmother   . Cancer Neg Hx    Social History  Substance Use Topics  . Smoking status: Never Smoker  . Smokeless tobacco: Never Used  . Alcohol use No    Review of Systems  Denies: HEADACHE, NAUSEA, ABDOMINAL PAIN, CHEST PAIN, CONGESTION, DYSURIA, SHORTNESS OF BREATH  Allergies  Review of patient's allergies indicates no known allergies.  Home Medications   Prior to Admission medications   Medication Sig Start Date End Date Taking? Authorizing Provider  ACCU-CHEK FASTCLIX LANCETS MISC Check sugar 6 x daily 05/17/15  Yes Verneda Skill, FNP  Brexpiprazole (REXULTI PO) Take 10 mg by mouth.   Yes Historical Provider, MD  dapagliflozin propanediol (FARXIGA) 10 MG TABS tablet Take 10 mg by mouth daily. 01/03/16  Yes Verneda Skill, FNP  dexmethylphenidate (FOCALIN XR) 20 MG 24 hr capsule Take 20 mg by mouth daily.   Yes Historical Provider, MD  glucose blood (ONETOUCH VERIO) test strip USE TO  CHECK BLOOD SUGAR 6 TIMES DAILY 05/17/15  Yes Verneda Skill, FNP  metFORMIN (GLUCOPHAGE) 1000 MG tablet Take 1 tablet (1,000 mg total) by mouth 2 (two) times daily with a meal. 08/25/15  Yes Verneda Skill, FNP   Meds Ordered and Administered this Visit  Medications - No data to display  BP 122/69 (BP Location: Left Arm)   Pulse 66   Temp 97.8 F (36.6 C) (Oral)   Resp 12   SpO2 100%  No data found.   Physical Exam NURSES NOTES AND VITAL SIGNS REVIEWED. CONSTITUTIONAL: Well developed, well nourished, no acute distress HEENT: normocephalic, atraumatic EYES: Conjunctiva b/l are injected, drainage.  NECK:normal ROM, supple, no adenopathy PULMONARY:No respiratory distress, normal effort ABDOMINAL: Soft, ND, NT BS+, No CVAT MUSCULOSKELETAL: Normal ROM of all extremities,  SKIN: warm and dry without rash PSYCHIATRIC: Mood and affect, behavior are normal  Urgent Care Course   Clinical Course   polytrim drops  Procedures (including critical care time)  Labs Review Labs Reviewed - No data to display  Imaging Review No results found.   Visual Acuity Review  Right Eye Distance:   Left Eye Distance:   Bilateral Distance:    Right Eye Near:   Left Eye Near:    Bilateral Near:        Return to work note provided/ MDM   1. Bilateral conjunctivitis     Patient is reassured  that there are no issues that require transfer to higher level of care at this time or additional tests. Patient is advised to continue home symptomatic treatment. Patient is advised that if there are new or worsening symptoms to attend the emergency department, contact primary care provider, or return to UC. Instructions of care provided discharged home in stable condition.    THIS NOTE WAS GENERATED USING A VOICE RECOGNITION SOFTWARE PROGRAM. ALL REASONABLE EFFORTS  WERE MADE TO PROOFREAD THIS DOCUMENT FOR ACCURACY.  I have verbally reviewed the discharge instructions with the patient. A  printed AVS was given to the patient.  All questions were answered prior to discharge.      Tharon AquasFrank C Merrit Friesen, PA 01/31/16 2004

## 2016-01-31 NOTE — ED Triage Notes (Signed)
[facility] a [facility]ness and irritation to his left eye that started this am. The patient denied any injury.

## 2016-01-31 NOTE — Discharge Instructions (Signed)
Warm compresses to your eyes or warm tea bags.   Wash your hands well, as you can re-infect your eyes or others.

## 2016-04-10 ENCOUNTER — Ambulatory Visit (INDEPENDENT_AMBULATORY_CARE_PROVIDER_SITE_OTHER): Payer: Self-pay | Admitting: Pediatric Endocrinology

## 2016-04-27 ENCOUNTER — Encounter (INDEPENDENT_AMBULATORY_CARE_PROVIDER_SITE_OTHER): Payer: Self-pay | Admitting: Family

## 2016-04-27 ENCOUNTER — Other Ambulatory Visit (INDEPENDENT_AMBULATORY_CARE_PROVIDER_SITE_OTHER): Payer: Self-pay | Admitting: *Deleted

## 2016-04-27 ENCOUNTER — Ambulatory Visit (INDEPENDENT_AMBULATORY_CARE_PROVIDER_SITE_OTHER): Payer: BLUE CROSS/BLUE SHIELD | Admitting: Family

## 2016-04-27 VITALS — BP 122/80 | HR 60 | Wt 269.2 lb

## 2016-04-27 DIAGNOSIS — IMO0001 Reserved for inherently not codable concepts without codable children: Secondary | ICD-10-CM

## 2016-04-27 DIAGNOSIS — Z794 Long term (current) use of insulin: Principal | ICD-10-CM

## 2016-04-27 DIAGNOSIS — L83 Acanthosis nigricans: Secondary | ICD-10-CM

## 2016-04-27 DIAGNOSIS — E1165 Type 2 diabetes mellitus with hyperglycemia: Secondary | ICD-10-CM | POA: Diagnosis not present

## 2016-04-27 DIAGNOSIS — E1110 Type 2 diabetes mellitus with ketoacidosis without coma: Secondary | ICD-10-CM

## 2016-04-27 LAB — LIPID PANEL
CHOL/HDL RATIO: 3.2 ratio (ref <5.0)
CHOLESTEROL: 167 mg/dL (ref <200)
HDL: 52 mg/dL (ref >40)
LDL Cholesterol: 70 mg/dL (ref <100)
TRIGLYCERIDES: 225 mg/dL — AB (ref <150)
VLDL: 45 mg/dL — AB (ref <30)

## 2016-04-27 LAB — POCT GLYCOSYLATED HEMOGLOBIN (HGB A1C): HEMOGLOBIN A1C: 8.8

## 2016-04-27 LAB — COMPREHENSIVE METABOLIC PANEL
ALK PHOS: 79 U/L (ref 40–115)
ALT: 26 U/L (ref 9–46)
AST: 31 U/L (ref 10–40)
Albumin: 4.5 g/dL (ref 3.6–5.1)
BUN: 16 mg/dL (ref 7–25)
CALCIUM: 9.2 mg/dL (ref 8.6–10.3)
CHLORIDE: 100 mmol/L (ref 98–110)
CO2: 23 mmol/L (ref 20–31)
Creat: 0.79 mg/dL (ref 0.60–1.35)
Glucose, Bld: 236 mg/dL — ABNORMAL HIGH (ref 70–99)
POTASSIUM: 3.9 mmol/L (ref 3.5–5.3)
Sodium: 136 mmol/L (ref 135–146)
TOTAL PROTEIN: 7 g/dL (ref 6.1–8.1)
Total Bilirubin: 1.5 mg/dL — ABNORMAL HIGH (ref 0.2–1.2)

## 2016-04-27 LAB — TSH: TSH: 1.32 m[IU]/L (ref 0.40–4.50)

## 2016-04-27 LAB — T4, FREE: Free T4: 1.2 ng/dL (ref 0.8–1.8)

## 2016-04-27 LAB — GLUCOSE, POCT (MANUAL RESULT ENTRY): POC GLUCOSE: 216 mg/dL — AB (ref 70–99)

## 2016-04-27 MED ORDER — DAPAGLIFLOZIN PROPANEDIOL 10 MG PO TABS: 10.0000 mg | ORAL_TABLET | Freq: Every day | ORAL | 6 refills | Status: DC

## 2016-04-27 NOTE — Progress Notes (Signed)
Subjective:  Subjective  Patient Name: Jared Potts Date of Birth: [birth year]  MRN: [id number]  Jared Potts  presents to the office today for follow-up evaluation and management of his type 2 diabetes, obesity, dyspepsia, schizophrenia, gynecomastia, hypertension, hyperlipidemia, thyroiditis, and noncompliance.  HISTORY OF PRESENT ILLNESS:   Jared Potts is a 20 y.o. African-American young man.    Jared Potts was accompanied by his mother.   1. On 05/11/09 the then 20 year old patient [facility] evaluation and treatment of new onset diabetes, mild-moderate diabetic ketoacidosis, dehydration, ketonuria, morbid obesity, acanthosis nigricans and gynecomastia in the setting of co-existent schizophrenia. Since his C-peptide was surprisingly low at 0.62 (normal 0.80-3.9) he was initially classified as having T1DM and was treated with Lantus as a basal insulin and Novolog aspart insulin, according to the 150/50/15 plan,  at mealtimes, bedtime, and 2:00 AM. Metformin was added to help with insulin resistance.     2. During his first year of having diabetes his BGs improved significantly. His HbA1c had decreased to 5.5% in July 2011, his C-peptide had increased to 8.45 in November 2011, and his insulin doses were progressively tapered. By September 2012 he had missed many doses of Lantus and Novolog, but his HbA1c had only increased to 6.1%. He had stopped taking insulin on his own by May 2013, but was still sometimes taking metformin. He subsequently stopped metformin as well. In October 2013 his C-peptide had increased to 2.58. He was started him on [name] in February 2014. By July his C-peptide had decreased to 1.08. At that time we discontinued [name] due to nausea and he was re-started him on his Lantus-Novolog plan. By February 2015, however, he had discontinued the Lantus and took the Novolog only sporadically. He was started on Bydureon that Spring,  but discontinued that drug soon after starting it due to the injections being painful. He was re-started on metformin and [name] in December 2015. He also took Lantus and Novolog infrequently. Last visit [name] was discontinued and he was started on Invokana. His HBA1c values in the past two years have varied from 5.8% to 10.3%.    3. The patient's last [facility] visit was on 01/03/16. In the interim, he has been generally healthy.   Jared Potts reports that he is tired. He has been working 3rd shift for [employer] 5 days per week. He feels like it is very hard working third shift and he does not have much time to take care of himself. He reports that he is taking [place]xiga "most" of the time and taking Metformin "some' of the time. He is checking his blood sugar about once per week per his report.   He states that he eats fast food every day before work and on the way home from work. He usually gets a burger and fries for dinner and then gets french toast sticks and juice for breakfast on his way home. He drinks 2-3 sugar sodas while working. He does not exercise, he feels like he is to tired after work and does not have time to exercise.     4. Pertinent Review of Systems:  Constitutional: The patient feels "Tired".  Eyes: Vision seems to be good. There are no recognized eye problems. His last eye exam was about one year ago. Discussed he is due for another exam with him today. . Neck: The patient has no complaints of anterior neck swelling, soreness, tenderness, pressure, discomfort, or difficulty swallowing.   Heart:  Heart rate increases with exercise or other physical activity. The patient has no complaints of palpitations, irregular heart beats, chest pain, or chest pressure.   Gastrointestinal: As above. Bowel movents seem normal. The patient has no complaints of acid reflux, stomach aches or pains, diarrhea, or constipation.  Legs: Muscle mass and strength seem normal. There are no complaints of numbness,  tingling, burning, or pain. No edema is noted.  Feet:  There are no other obvious foot problems. There are no complaints of numbness, tingling, burning, or pain. No edema is noted.  Neurologic: There are no recognized problems with muscle movement and strength, sensation, or coordination.  BG printout: Not checking sugars.  Last visit BG printout: He has 2 blood sugar checks over the past 1 month. Both blood sugars were 181.   Annual Labs- December 2017   PAST MEDICAL, FAMILY, AND SOCIAL HISTORY  Past Medical History:  Diagnosis Date  . Attention deficit disorder   . Combined hyperlipidemia   . Goiter   . Gynecomastia, male   . Hypertension   . Noncompliance with treatment   . Obesity, Class III, BMI 40-49.9 (morbid obesity) (HCC)   . 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 Thyroiditis, autoimmune   . Type 2 diabetes mellitus in patient age 4-19 years with HbA1C goal below 7.5     Family History  Problem Relation Age of Onset  . Obesity Mother   . Diabetes Maternal Aunt   . Obesity Maternal Aunt   . Diabetes Maternal Grandmother   . Obesity Maternal Grandmother   . Cancer Neg Hx      Current Outpatient Prescriptions:  .  ACCU-CHEK FASTCLIX LANCETS MISC, Check sugar 6 x daily, Disp: 204 each, Rfl: 3 .  Brexpiprazole (REXULTI PO), Take 10 mg by mouth., Disp: , Rfl:  .  dapagliflozin propanediol (FARXIGA) 10 MG TABS tablet, Take 10 mg by mouth daily., Disp: 30 tablet, Rfl: 6 .  dexmethylphenidate (FOCALIN XR) 20 MG 24 hr capsule, Take 20 mg by mouth daily., Disp: , Rfl:  .  glucose blood (ONETOUCH VERIO) test strip, USE TO CHECK BLOOD SUGAR 6 TIMES DAILY, Disp: 200 each, Rfl: 6 .  metFORMIN (GLUCOPHAGE) 1000 MG tablet, Take 1 tablet (1,000 mg total) by mouth 2 (two) times daily with a meal., Disp: 60 tablet, Rfl: 6  Allergies as of 04/27/2016  . (No Known Allergies)     reports that he has never smoked. He has never used smokeless tobacco. He reports that he does not drink alcohol  or use drugs. Pediatric History  Patient Guardian Status  . Mother:  Jared Potts,Jared Potts   Other Topics Concern  . Not on file   Social History Narrative   Lives with mom and dad and brother.   He attends Working at [employer].  Activities: Walking at work. Primary Care Provider: Evlyn Courier, MD  Psychiatry: Dr. Beverly Milch  REVIEW OF SYSTEMS: There are no other significant problems involving Jared Potts's other body systems.    Objective:  Objective  Vital Signs:  BP 122/80   Pulse 60   Wt 269 lb 3.2 oz (122.1 kg)    Ht Readings from Last 3 Encounters:  12/30/12 6' 0.44" (1.84 m) (87 %, Z= 1.11)*  11/17/12 6' 0.5" (1.842 m) (87 %, Z= 1.14)*  06/17/12 6' 0.44" (1.84 m) (88 %, Z= 1.16)*   * Growth percentiles are based on CDC 2-20 Years data.   Wt Readings from Last 3 Encounters:  04/27/16 269  lb 3.2 oz (122.1 kg)  01/03/16 271 lb 9.6 oz (123.2 kg)  08/25/15 244 lb 9.6 oz (110.9 kg)   HC Readings from Last 3 Encounters:  No data found for HC   There is no height or weight on file to calculate BSA. Facility age limit for growth percentiles is 20 years. Facility age limit for growth percentiles is 20 years.    PHYSICAL EXAM:  Constitutional: The patient appears healthy and well nourished. He is obese.  Head: The head is normocephalic. Dreadlocks in place.  Face: The face appears normal. There are no obvious dysmorphic features. Has a mustache.  Eyes: The eyes appear to be normally formed and spaced. Gaze is conjugate. There is no obvious arcus or proptosis. Moisture appears normal. Ears: The ears are normally placed and appear externally normal. Mouth: The oropharynx and tongue appear normal. Dentition appears to be normal for age. Oral moisture is normal. Neck: The neck appears to be visibly normal, has acanthosis on posterior.  Lungs: The lungs are clear to auscultation. Air movement is good. Heart: Heart rate and rhythm are regular. Heart sounds S1 and S2 are  normal. I did not appreciate any pathologic cardiac murmurs. Hands: There is no obvious tremor. Phalangeal and metacarpophalangeal joints are normal. Palmar muscles are normal for age. Palmar skin is normal. Palmar moisture is also normal.   LAB DATA:   Results for orders placed or performed in visit on 04/27/16  POCT Glucose (CBG)  Result Value Ref Range   POC Glucose 216 (A) 70 - 99 mg/dl  POCT HgB [id number]  Result Value Ref Range   Hemoglobin A1C 8.8        Assessment and Plan:  Assessment  ASSESSMENT:  1. Type 2 diabetes: He diabetes control has worsened. His A1c is now much higher. He is not taking medicine consistently and has been making poor diet choices.  2. Morbid obesity: stable    3. Acanthosis: Consistent with insulin resistance.    PLAN:  1. Diagnostic: A1C as above. Draw annual labs today.   2. Therapeutic: Continue metformin, 1000 mg, twice a day. Continue Farxiga to 10 mg daily. Will refer to adult endocrinology due to his age.  3. Patient education: Discussed type 2 diabetes and possible complications from uncontrolled diabetes. Discussed importance of taking his medications every day as prescribed. Discussed healthy diet and packing food instead of eating fast food. Discussed importance of exercise. Stressed the importance of decreasing sugar drinks to help with insulin resistance. Discussed transitioning to adult endocrinology. .    6. Follow-up: 3 months     Level of Service: This visit lasted in excess of 25 minutes. More than 50% of the visit was devoted to counseling.    Gretchen ShortSpenser Emiley Digiacomo, FNP-C

## 2016-04-27 NOTE — Patient Instructions (Signed)
-   Continue Farxiga 10 mg daily  - Continue 1000mg  of Metformin  - Check blood sugar once per week  - Exercise daily  - Eat healthy, well balanced diet   - Eliminate sugar drinks  - Labs today  - Will refer to adult endocrinology. [place] [facility].

## 2016-04-28 LAB — MICROALBUMIN / CREATININE URINE RATIO
Creatinine, Urine: 53 mg/dL (ref 20–370)
Microalb, Ur: <0.2 mg/dL

## 2016-06-28 LAB — HM DIABETES EYE EXAM

## 2016-08-22 ENCOUNTER — Other Ambulatory Visit: Payer: Self-pay | Admitting: Pediatrics

## 2016-08-22 DIAGNOSIS — E1100 Type 2 diabetes mellitus with hyperosmolarity without nonketotic hyperglycemic-hyperosmolar coma (NKHHC): Secondary | ICD-10-CM

## 2016-08-22 NOTE — Telephone Encounter (Signed)
Endo

## 2016-09-19 ENCOUNTER — Other Ambulatory Visit: Payer: Self-pay | Admitting: Pediatrics

## 2016-09-19 DIAGNOSIS — E1100 Type 2 diabetes mellitus with hyperosmolarity without nonketotic hyperglycemic-hyperosmolar coma (NKHHC): Secondary | ICD-10-CM

## 2016-09-20 NOTE — Telephone Encounter (Signed)
Endo

## 2016-11-18 ENCOUNTER — Other Ambulatory Visit: Payer: Self-pay | Admitting: Pediatrics

## 2016-11-18 DIAGNOSIS — E119 Type 2 diabetes mellitus without complications: Secondary | ICD-10-CM

## 2016-11-19 NOTE — Telephone Encounter (Signed)
Endo

## 2017-02-07 ENCOUNTER — Ambulatory Visit (INDEPENDENT_AMBULATORY_CARE_PROVIDER_SITE_OTHER): Payer: BLUE CROSS/BLUE SHIELD | Admitting: Internal Medicine

## 2017-02-07 ENCOUNTER — Encounter: Payer: Self-pay | Admitting: Internal Medicine

## 2017-02-07 VITALS — BP 132/82 | HR 71 | Ht 73.0 in | Wt 279.0 lb

## 2017-02-07 DIAGNOSIS — E1165 Type 2 diabetes mellitus with hyperglycemia: Secondary | ICD-10-CM | POA: Diagnosis not present

## 2017-02-07 DIAGNOSIS — Z23 Encounter for immunization: Secondary | ICD-10-CM

## 2017-02-07 LAB — POCT GLYCOSYLATED HEMOGLOBIN (HGB A1C): HEMOGLOBIN A1C: 7.3

## 2017-02-07 MED ORDER — FREESTYLE LIBRE READER DEVI: 1.0000 | Freq: Three times a day (TID) | 1 refills | Status: DC

## 2017-02-07 MED ORDER — FREESTYLE LIBRE SENSOR SYSTEM MISC: 1.0000 | 11 refills | Status: DC

## 2017-02-07 NOTE — Progress Notes (Signed)
Patient ID: Jared Potts, male   DOB: 01-Mar-1996, 21 y.o.   MRN: [id number]   HPI: Jared Potts is a 21 y.o.-year-old male, referred by his PCP, Dr. Loleta Chance for management of DM2, dx in 2010, non-insulin-dependent, uncontrolled, without long term complications. He saw Dr. Vanessa [place] before. He is here with his mother who offers part of the history, especially about his diabetes history and dietary habits.  Last hemoglobin A1c was: Lab Results  Component Value Date   HGBA1C 8.8 04/27/2016   HGBA1C 6.7 01/03/2016   HGBA1C 6.0 08/25/2015   Pt is on a regimen of: - Metformin 1000 mg 1x a day daily in pm - Invokana >> Farxiga 10 mg daily in pm - added 2017 He was on [name] before >> stopped 2017 per insurance coverage  Pt checks his sugars 1x a day and they are: - am: 121-167, 205, 272 - 2h after b'fast: n/c - before lunch: 87 - 2h after lunch: n/c - before dinner: n/c - 2h after dinner: 128 - bedtime:  - nighttime: n/c Lowest sugar was 90; he has hypoglycemia awareness at 90s.  Highest sugar was 272.  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  - no numbness and tingling in his feet.  Pt has FH of DM in [place], aunt, cousins..  ROS: Constitutional: no weight gain/loss, no fatigue, no subjective hyperthermia/hypothermia Eyes: no blurry vision, no xerophthalmia ENT: no  sore throat, no nodules palpated in throat, no dysphagia/odynophagia, no hoarseness Cardiovascular: no CP/SOB/palpitations/leg swelling Respiratory: no cough/SOB Gastrointestinal: no N/V/D/C Musculoskeletal: no muscle/joint aches Skin: no rashes Neurological: no tremors/numbness/tingling/dizziness Psychiatric: no depression/+ anxiety  Past Medical History:  Diagnosis Date  . Attention deficit disorder   . Combined hyperlipidemia   . Goiter   . Gynecomastia, male   . Hypertension   . Noncompliance with treatment   . Obesity, Class III, BMI 40-49.9 (morbid obesity) (HCC)   . 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 Thyroiditis, autoimmune   . Type 2 diabetes mellitus in patient age 69-19 years with HbA1C goal below 7.5    Past Surgical History:  Procedure Laterality Date  . CIRCUMCISION     Social History   Social History  . Marital status: Single    Spouse name: Jared Potts  . Number of children: 0   Occupational History  . Company secretary   Social History Main Topics  . Smoking status: Never Smoker  . Smokeless tobacco: Never Used  . Alcohol use No  . Drug use: No   Social History Narrative   Lives with mom and dad and brother.   Current Outpatient Prescriptions on File Prior to Visit  Medication Sig Dispense Refill  . Brexpiprazole (REXULTI PO) Take 10 mg by mouth.    Marland Kitchen  dexmethylphenidate (FOCALIN XR) 20 MG 24 hr capsule Take 20 mg by mouth daily.    Marland Kitchen FARXIGA 10 MG TABS tablet TAKE 1 TABLET EVERY DAY 30 tablet 3  . glucose blood (ONETOUCH VERIO) test strip USE TO CHECK BLOOD SUGAR 6 TIMES DAILY 200 each 6  . metFORMIN (GLUCOPHAGE) 1000 MG tablet Take 1 tablet (1,000 mg total) by mouth 2 (two) times daily with a meal. 60 tablet 6   No current facility-administered medications on file prior to visit.    No Known Allergies Family History  Problem Relation Age of Onset  . Obesity Mother   . Diabetes Maternal Aunt   . Obesity Maternal Aunt   . Diabetes Maternal Grandmother    . Obesity Maternal Grandmother   . Cancer Neg Hx    PE: BP 132/82 (BP Location: Left Arm, Patient Position: Sitting)   Pulse 71   Ht  (1.854 m)   Wt 279 lb (126.6 kg)   SpO2 98%   BMI 36.81 kg/m  Wt Readings from Last 3 Encounters:  02/07/17 279 lb (126.6 kg)  04/27/16 269 lb 3.2 oz (122.1 kg)  01/03/16 271 lb 9.6 oz (123.2 kg)   Constitutional: obese, in NAD Eyes: PERRLA, EOMI, no exophthalmos ENT: moist mucous membranes, no thyromegaly, no cervical lymphadenopathy Cardiovascular: RRR, No MRG Respiratory: CTA B Gastrointestinal: abdomen soft, NT, ND, BS+ Musculoskeletal: no deformities, strength intact in all 4 Skin: moist, warm, + B feet onychodystrophy, dyshidrotic eczema and calluses Neurological: no tremor with outstretched hands, DTR normal in all 4  ASSESSMENT: 1. DM2, non-insulin-dependent, uncontrolled, without long term complications, but with hyperglycemia  2. Obesity class 2 BMI Classification:  < 18.5 underweight   18.5-24.9 normal weight   25.0-29.9 overweight   30.0-34.9 class I obesity   35.0-39.9 class II obesity   ? 40.0 class III obesity   PLAN:  1. Patient with long-standing, uncontrolled diabetes, on oral antidiabetic regimen, which became insufficient. HbA1c today is 7.3% today (better), but still elevated. -  upon discussion with patient and his mother, he is having a very  poor diet, with mostly fast food or highly  processed meals. I strongly suggested he changes this >> referred to nutrition - we will also increase Metformin to 2x a day to help with sugars o/n as these are highest for him as he goes to bed immediately after eating in am  - I suggested to:  Patient Instructions  Please continue: - Farxiga 10 mg daily in the pm  Please increase: - Metformin 1000 mg to 2x a day with meals.  Please return in 1.5 months with your sugar log.   - Strongly advised him to start checking sugars at different times of the day - check 2  times a day, rotating checks - given sugar log and advised how to fill it and to bring it at next appt  - given foot care handout and explained the principles  - given instructions for hypoglycemia management "15-15 rule"  - advised for yearly eye exams >> he is UTD - discussed the need to come back for another visit until I can fill out his DMV form - given flu shot today - discussed the need to see a podiatrist due to multiple foot skin pbs (see PE) - Return to clinic in 1.5 mo with sugar log   2. Obesity class 2 - needs to improve diet! - referred to nutrition - may need to go back to an GLP1 R  agonist  Carlus Pavlov, MD PhD [facility]

## 2017-02-07 NOTE — Patient Instructions (Addendum)
Please continue: - Farxiga 10 mg daily in the pm  Please increase: - Metformin 1000 mg to 2x a day with meals.  Please return in 1.5 months with your sugar log.   PATIENT INSTRUCTIONS FOR TYPE 2 DIABETES:  DIET AND EXERCISE Diet and exercise is an important part of diabetic treatment.  We recommended aerobic exercise in the form of brisk walking (working between 40-60% of maximal aerobic capacity, similar to brisk walking) for 150 minutes per week (such as 30 minutes five days per week) along with 3 times per week performing 'resistance' training (using various gauge rubber tubes with handles) 5-10 exercises involving the major muscle groups (upper body, lower body and core) performing 10-15 repetitions (or near fatigue) each exercise. Start at half the above goal but build slowly to reach the above goals. If limited by weight, joint pain, or disability, we recommend daily walking in a swimming pool with water up to waist to reduce pressure from joints while allow for adequate exercise.    BLOOD GLUCOSES Monitoring your blood glucoses is important for continued management of your diabetes. Please check your blood glucoses 2-4 times a day: fasting, before meals and at bedtime (you can rotate these measurements - e.g. one day check before the 3 meals, the next day check before 2 of the meals and before bedtime, etc.).   HYPOGLYCEMIA (low blood sugar) Hypoglycemia is usually a reaction to not eating, exercising, or taking too much insulin/ other diabetes drugs.  Symptoms include tremors, sweating, hunger, confusion, headache, etc. Treat IMMEDIATELY with 15 grams of Carbs: . 4 glucose tablets .  cup regular juice/soda . 2 tablespoons raisins . 4 teaspoons sugar . 1 tablespoon honey Recheck blood glucose in 15 mins and repeat above if still symptomatic/blood glucose <100.  RECOMMENDATIONS TO REDUCE YOUR RISK OF DIABETIC COMPLICATIONS: * Take your prescribed MEDICATION(S) * Follow a DIABETIC  diet: Complex carbs, fiber rich foods, (monounsaturated and polyunsaturated) fats * AVOID saturated/trans fats, high fat foods, >2,300 mg salt per day. * EXERCISE at least 5 times a week for 30 minutes or preferably daily.  * DO NOT SMOKE OR DRINK more than 1 drink a day. * Check your FEET every day. Do not wear tightfitting shoes. Contact us if you develop an ulcer * See your EYE doctor once a year or more if needed * Get a FLU shot once a year * Get a PNEUMONIA vaccine once before and once after age 40 years  GOALS:  * Your Hemoglobin A1c of <7%  * fasting sugars need to be <130 * after meals sugars need to be <180 (2h after you start eating) * Your Systolic BP should be 140 or lower  * Your Diastolic BP should be 80 or lower  * Your HDL (Good Cholesterol) should be 40 or higher  * Your LDL (Bad Cholesterol) should be 100 or lower. * Your Triglycerides should be 150 or lower  * Your Urine microalbumin (kidney function) should be <30 * Your Body Mass Index should be 25 or lower    Please consider the following ways to cut down carbs and fat and increase fiber and micronutrients in your diet: - substitute whole grain for white bread or pasta - substitute brown rice for white rice - substitute 90-calorie flat bread pieces for slices of bread when possible - substitute sweet potatoes or yams for white potatoes - substitute humus for margarine - substitute tofu for cheese when possible - substitute almond or rice milk  for regular milk (would not drink soy milk daily due to concern for soy estrogen influence on breast cancer risk) - substitute dark chocolate for other sweets when possible - substitute water - can add lemon or orange slices for taste - for diet sodas (artificial sweeteners will trick your body that you can eat sweets without getting calories and will lead you to overeating and weight gain in the long run) - do not skip breakfast or other meals (this will slow down the  metabolism and will result in more weight gain over time)  - can try smoothies made from fruit and almond/rice milk in am instead of regular breakfast - can also try old-fashioned (not instant) oatmeal made with almond/rice milk in am - order the dressing on the side when eating salad at a restaurant (pour less than half of the dressing on the salad) - eat as little meat as possible - can try juicing, but should not forget that juicing will get rid of the fiber, so would alternate with eating raw veg./fruits or drinking smoothies - use as little oil as possible, even when using olive oil - can dress a salad with a mix of balsamic vinegar and lemon juice, for e.g. - use agave nectar, stevia sugar, or regular sugar rather than artificial sweateners - steam or broil/roast veggies  - snack on veggies/fruit/nuts (unsalted, preferably) when possible, rather than processed foods - reduce or eliminate aspartame in diet (it is in diet sodas, chewing gum, etc) Read the labels!  Try to read Dr. Katherina Right book: "Program for Reversing Diabetes" for other ideas for healthy eating. Please look up "The Engine 2 diet" by Juanetta Beets.

## 2017-03-06 ENCOUNTER — Encounter: Payer: BLUE CROSS/BLUE SHIELD | Attending: Internal Medicine | Admitting: Skilled Nursing Facility1

## 2017-03-06 ENCOUNTER — Encounter: Payer: Self-pay | Admitting: Skilled Nursing Facility1

## 2017-03-06 DIAGNOSIS — Z713 Dietary counseling and surveillance: Secondary | ICD-10-CM | POA: Diagnosis present

## 2017-03-06 DIAGNOSIS — E1165 Type 2 diabetes mellitus with hyperglycemia: Secondary | ICD-10-CM | POA: Insufficient documentation

## 2017-03-06 NOTE — Patient Instructions (Addendum)
-  Eat before starting [employer]: 3 hotdogs using 1 slice of bread per wiener + 1 fruit cup + carrots  -Eat after [employer]: 1 sandwich + fruit cup + carrots OR Peanutbutter and bread with milk   -First break at [employer]: Vegetables with the chef boyardee   -Only by fruit in its own 100% juice or packed in water  -Read your nutrition facts label and look for 0g of sugar  -Get to the podiatrist   -Try sugar free jelly  -NEVER use the same needle more than once  -Use all of your fingers to check your blood sugar and prick the side of the end of the finger rather than the tip

## 2017-03-06 NOTE — Progress Notes (Signed)
Diabetes Self-Management Education  Visit Type: First/Initial  03/06/2017  [name], identified by name and date of birth, is a 21 y.o. male with a diagnosis of Diabetes: Type 2.   ASSESSMENT  Height 6\' 1"  (1.854 m), weight 278 lb (126.1 kg). Body mass index is 36.68 kg/m. Pt states he works at [employer] and [employer] back to back chcking his blood sugars 9-10pm every day; working 3rd shift and twighlight shift off at 7:30 goes in at [employer]11am. Pt states his work schedule is frustrating not enabling him to do things he wants to do. Pt states he sleeps 5-6 hours most days of week. Pt states he lives with his family. Pt states his blood sugars 30 minutes after eating is 130-140. Pt states if he checks his sugars before eating they are around 110--120. Pt states his ADHD medicine suppresses his appetite.  Pt seemed to have trouble with verbalizing his point.  Recall: 2 Sandwiches with ranch dressing and lunch meat    Maybe cool aid or fruit cup or fruit  OR  3 hotdogs with 1 slice of bread with ketchup and mustard with chips or fruit  OR   2 hot pockets with fruit cup      Diabetes Self-Management Education - 03/06/17 0924      Visit Information   Visit Type First/Initial     Initial Visit   Diabetes Type Type 2   Are you currently following a meal plan? No   Are you taking your medications as prescribed? Yes     Health Coping   How would you rate your overall health? Good     Psychosocial Assessment   Patient Belief/Attitude about Diabetes Defeat/Burnout   Patient Concerns Nutrition/Meal planning     Pre-Education Assessment   Patient understands the diabetes disease and treatment process. Needs Instruction   Patient understands incorporating nutritional management into lifestyle. Needs Instruction   Patient undertands incorporating physical activity into lifestyle. Needs Instruction   Patient understands using medications safely. Needs Instruction   Patient  understands monitoring blood glucose, interpreting and using results Needs Instruction   Patient understands prevention, detection, and treatment of acute complications. Needs Instruction   Patient understands prevention, detection, and treatment of chronic complications. Needs Instruction   Patient understands how to develop strategies to address psychosocial issues. Needs Instruction   Patient understands how to develop strategies to promote health/change behavior. Needs Instruction     Complications   How often do you check your blood sugar? 1-2 times/day   Postprandial Blood glucose range (mg/dL) [id number]   Have you had a dilated eye exam in the past 12 months? No   Have you had a dental exam in the past 12 months? No   Are you checking your feet? No     Exercise   Exercise Type Light (walking / raking leaves)     Patient Education   Previous Diabetes Education No   Disease state  Definition of diabetes, type 1 and 2, and the diagnosis of diabetes;Factors that contribute to the development of diabetes   Nutrition management  Role of diet in the treatment of diabetes and the relationship between the three main macronutrients and blood glucose level;Food label reading, portion sizes and measuring food.;Carbohydrate counting   Monitoring Taught/evaluated SMBG meter.;Purpose and frequency of SMBG.;Daily foot exams;Yearly dilated eye exam   Psychosocial adjustment Worked with patient to identify barriers to care and solutions     Post-Education Assessment   Patient  understands the diabetes disease and treatment process. Demonstrates understanding / competency   Patient understands incorporating nutritional management into lifestyle. Demonstrates understanding / competency   Patient undertands incorporating physical activity into lifestyle. Demonstrates understanding / competency   Patient understands using medications safely. Demonstrates understanding / competency   Patient understands  monitoring blood glucose, interpreting and using results Demonstrates understanding / competency   Patient understands prevention, detection, and treatment of acute complications. Demonstrates understanding / competency   Patient understands prevention, detection, and treatment of chronic complications. Demonstrates understanding / competency   Patient understands how to develop strategies to address psychosocial issues. Demonstrates understanding / competency   Patient understands how to develop strategies to promote health/change behavior. Demonstrates understanding / competency     Outcomes   Future DMSE PRN   Program Status Completed      Individualized Plan for Diabetes Self-Management Training:   Learning Objective:  Patient will have a greater understanding of diabetes self-management. Patient education plan is to attend individual and/or group sessions per assessed needs and concerns.   Plan:   Patient Instructions  -Eat before starting [employer]: 3 hotdogs using 1 slice of bread per wiener + 1 fruit cup + carrots  -Eat after [employer]: 1 sandwich + fruit cup + carrots OR Peanutbutter and bread with milk   -First break at [employer]: Vegetables with the chef boyardee   -Only by fruit in its own 100% juice or packed in water  -Read your nutrition facts label and look for 0g of sugar  -Get to the podiatrist   -Try sugar free jelly  -NEVER use the same needle more than once  -Use all of your fingers to check your blood sugar and prick the side of the end of the finger rather than the tip

## 2017-03-11 ENCOUNTER — Telehealth: Payer: Self-pay | Admitting: Internal Medicine

## 2017-03-11 ENCOUNTER — Other Ambulatory Visit: Payer: Self-pay

## 2017-03-11 DIAGNOSIS — E119 Type 2 diabetes mellitus without complications: Secondary | ICD-10-CM

## 2017-03-11 MED ORDER — METFORMIN HCL 1000 MG PO TABS: 1000.0000 mg | ORAL_TABLET | Freq: Two times a day (BID) | ORAL | 6 refills | Status: DC

## 2017-03-11 MED ORDER — GLUCOSE BLOOD VI STRP: ORAL_STRIP | 6 refills | Status: DC

## 2017-03-11 MED ORDER — ONETOUCH DELICA LANCETS 33G MISC: 5 refills | Status: DC

## 2017-03-11 NOTE — Telephone Encounter (Signed)
Patient need a refill metformin, needles to prick his finger, test strips.send to  [facility] on [address], mom request a call back please advise

## 2017-03-11 NOTE — Telephone Encounter (Signed)
Submitted

## 2017-03-18 ENCOUNTER — Ambulatory Visit: Payer: BLUE CROSS/BLUE SHIELD | Admitting: Podiatry

## 2017-03-18 ENCOUNTER — Encounter: Payer: Self-pay | Admitting: Podiatry

## 2017-03-18 VITALS — BP 119/76 | HR 76 | Resp 16

## 2017-03-18 DIAGNOSIS — L600 Ingrowing nail: Secondary | ICD-10-CM | POA: Diagnosis not present

## 2017-03-18 DIAGNOSIS — Z79899 Other long term (current) drug therapy: Secondary | ICD-10-CM | POA: Diagnosis not present

## 2017-03-18 DIAGNOSIS — B351 Tinea unguium: Secondary | ICD-10-CM

## 2017-03-18 MED ORDER — CEPHALEXIN 500 MG PO CAPS: 500.0000 mg | ORAL_CAPSULE | Freq: Three times a day (TID) | ORAL | 2 refills | Status: DC

## 2017-03-18 NOTE — Patient Instructions (Signed)

## 2017-03-21 NOTE — Progress Notes (Signed)
Subjective:    Patient ID: Jared Potts, male   DOB: 21 y.o.   MRN: [id number]   HPI 21 year old male presents the office today for concerns of ingrown toenail to the left big toe which is been ongoing for about 1 month.  Area hurts with pressure and shoes.  He has been putting antibiotic cream on the area daily.  Denies any drainage or pus.  He has had no recent treatment for this.  He also states his nails are discolored and thick he has no pain to the other nails.  He has no other concerns.   Review of Systems  All other systems reviewed and are negative.  Past Medical History:  Diagnosis Date  . Attention deficit disorder   . Combined hyperlipidemia   . Goiter   . Gynecomastia, male   . Hypertension   . Noncompliance with treatment   . Obesity, Class III, BMI 40-49.9 (morbid obesity) (HCC)   . 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 Thyroiditis, autoimmune   . Type 2 diabetes mellitus in patient age 22-19 years with HbA1C goal below 7.5     Past Surgical History:  Procedure Laterality Date  . CIRCUMCISION       Current Outpatient Medications:  .  Brexpiprazole (REXULTI PO), Take 10 mg by mouth., Disp: , Rfl:  .  Continuous Blood Gluc Receiver (FREESTYLE LIBRE READER) DEVI, 1 Device by Does not apply route 3 (three) times daily., Disp: 1 Device, Rfl: 1 .  Continuous Blood Gluc Sensor (FREESTYLE LIBRE SENSOR SYSTEM) MISC, 1 Device by Does not apply route every 30 (thirty) days., Disp: 3 each, Rfl: 11 .  dexmethylphenidate (FOCALIN XR) 20 MG 24 hr capsule, Take 20 mg by mouth daily., Disp: , Rfl:  .  FARXIGA 10 MG TABS tablet, TAKE 1 TABLET EVERY DAY, Disp: 30 tablet, Rfl: 3 .  glucose blood (ONETOUCH VERIO) test strip, USE TO CHECK BLOOD SUGAR 6 TIMES DAILY, Disp: 200 each, Rfl: 6 .  metFORMIN (GLUCOPHAGE) 1000 MG tablet, Take 1 tablet (1,000 mg total) 2 (two) times daily with a meal by mouth., Disp: 60 tablet, Rfl: 6 .  ONETOUCH DELICA LANCETS 33G MISC, Use to check sugar 6 times  daily, Disp: 600 each, Rfl: 5 .  cephALEXin (KEFLEX) 500 MG capsule, Take 1 capsule (500 mg total) 3 (three) times daily by mouth., Disp: 30 capsule, Rfl: 2  Allergies  Allergen Reactions  . Citrus     Pt stated, "breaks out in blisters on fingers"    Social History   Socioeconomic History  . Marital status: Single    Spouse name: Not on file  . Number of children: Not on file  . Years of education: Not on file  . Highest education level: Not on file  Social Needs  . Financial resource strain: Not on file  . Food insecurity - worry: Not on file  . Food insecurity - inability: Not on file  . Transportation needs - medical: Not on file  . Transportation needs - non-medical: Not on file  Occupational History  . Not on file  Tobacco Use  . Smoking status: Never Smoker  . Smokeless tobacco: Never Used  Substance and Sexual Activity  . Alcohol use: No  . Drug use: No  . Sexual activity: Not Currently  Other Topics Concern  . Not on file  Social History Narrative   Lives with mom and dad and brother.         Objective:  Physical Exam General: AAO x3, NAD-presents with mother  Dermatological: The left hallux toenail is hypertrophic, dystrophic, discolored with yellow to brown discoloration as well as the other toenails.  On the left hallux toenail is infiltration of both the medial lateral aspects of the nail with tenderness to palpation.  There is localized edema and faint erythema but there is no drainage or pus expressed.  There is no fluctuance or crepitus.  There is no open lesions or pre-ulcerative lesions identified otherwise.  Vascular: Dorsalis Pedis artery and Posterior Tibial artery pedal pulses are 2/4 bilateral with immedate capillary fill time.  There is no pain with calf compression, swelling, warmth, erythema.   Neruologic: Grossly intact via light touch bilateral.  Protective threshold with Semmes Wienstein monofilament intact to all pedal sites  bilateral.  Musculoskeletal: No gross boney pedal deformities bilateral. No pain, crepitus, or limitation noted with foot and ankle range of motion bilateral. Muscular strength 5/5 in all groups tested bilateral.  Gait: Unassisted, Nonantalgic.      Assessment:     21 year old male with ingrown toenail left hallux, mycosis    Plan:        -Treatment options discussed including all alternatives, risks, and complications -Etiology of symptoms were discussed -In regards to nail fungus we discussed treatment options and he does do oral Lamisil.  Foot work was ordered today including a CBC and LFT. -At this time, the patient is requesting partial nail removal with chemical matricectomy to the symptomatic portion of the nail. Risks and complications were discussed with the patient for which they understand and  v written consent was obtained. Under sterile conditions a total of 3 mL of a mixture of 2% lidocaine plain and 0.5% Marcaine plain was infiltrated in a hallux block fashion. Once anesthetized, the skin was prepped in sterile fashion. A tourniquet was then applied. Next the medial lateral aspect of hallux nail border was then sharply excised making sure to remove the entire offending nail border. Once the nails were ensured to be removed area was debrided and the underlying skin was intact. There is no purulence identified in the procedure. Next phenol was then applied under standard conditions and copiously irrigated. Silvadene was applied. A dry sterile dressing was applied. After application of the dressing the tourniquet was removed and there is found to be an immediate capillary refill time to the digit. The patient tolerated the procedure well any complications. Post procedure instructions were discussed the patient for which he verbally understood. Follow-up in one week for nail check or sooner if any problems are to arise. Discussed signs/symptoms of infection and directed to call the  office immediately should any occur or go directly to the emergency room. In the meantime, encouraged to call the office with any questions, concerns, changes symptoms. -Keflex  Vivi BarrackMatthew R Wagoner DPM

## 2017-03-25 ENCOUNTER — Ambulatory Visit: Payer: BLUE CROSS/BLUE SHIELD | Admitting: Podiatry

## 2017-04-05 ENCOUNTER — Ambulatory Visit: Payer: BLUE CROSS/BLUE SHIELD | Admitting: Podiatry

## 2017-04-08 ENCOUNTER — Ambulatory Visit: Payer: BLUE CROSS/BLUE SHIELD | Admitting: Internal Medicine

## 2017-04-08 NOTE — Progress Notes (Deleted)
Patient ID: Jared Potts, male   DOB: May 01, 1996, 21 y.o.   MRN: [id number]   HPI: Jared Potts is a 21 y.o.-year-old male, initially referred by his PCP, Dr. Loleta Chance, now returning for follow-up for DM2, dx in 2010, non-insulin-dependent, uncontrolled, without long term complications. He saw Dr. Vanessa [place] before. Last visit with me 2 months ago. He is here with his mother who offers part of the history, especially about his diabetes history and dietary habits.    Last hemoglobin A1c was: Lab Results  Component Value Date   HGBA1C 7.3 02/07/2017   HGBA1C 8.8 04/27/2016   HGBA1C 6.7 01/03/2016   Pt is on a regimen of: - Metformin 1000 mg 1x a day daily in pm >> 2x a day with meals - Invokana >> Farxiga 10 mg daily in pm - added 2017 He was on [name] before >> stopped 2017 per insurance coverage  Pt checks his sugars once a day: - am: 121-167, 205, 272 - 2h after b'fast: n/c - before lunch: 87 - 2h after lunch: n/c - before dinner: n/c - 2h after dinner: 128 - bedtime:  - nighttime: n/c Lowest sugar was 90 >> ***; he has hypoglycemia awareness at 90s.  Highest sugar was 272 >> ***.  He works 2 jobs: [employer]: 5 pm - 10 pm then 11 pm - 7:30 am   Glucometer: [employer] psych[employer] IQ  Pt's meals are: - Breakfast: chicken biscuit ([place]) - Lunch: skips - Dinner: hot pockets, PB and jelly, pizza, apple sauce, [place] bar - Snacks: protein drinks  -No CKD, last BUN/creatinine:  Lab Results  Component Value Date   BUN 16 04/27/2016   BUN 17 04/14/2015   CREATININE 0.79 04/27/2016   CREATININE 0.83 04/14/2015   -+ Dyslipidemia; last set of lipids: Lab Results  Component Value Date   CHOL 167 04/27/2016   HDL 52 04/27/2016   LDLCALC 70 04/27/2016   TRIG 225 (H) 04/27/2016   CHOLHDL 3.2 04/27/2016   - last eye exam was in 06/2016: No DR - no numbness and tingling in his feet.  Pt has FH of DM in MGM, aunt, cousins..  ROS: Constitutional: no weight gain/no weight  loss, no fatigue, no subjective hyperthermia, no subjective hypothermia Eyes: no blurry vision, no xerophthalmia ENT: no sore throat, no nodules palpated in throat, no dysphagia, no odynophagia, no hoarseness Cardiovascular: no CP/no SOB/no palpitations/no leg swelling Respiratory: no cough/no SOB/no wheezing Gastrointestinal: no N/no V/no D/no C/no acid reflux Musculoskeletal: no muscle aches/no joint aches Skin: no rashes, no hair loss Neurological: no tremors/no numbness/no tingling/no dizziness  I reviewed pt's medications, allergies, PMH, social hx, family hx, and changes were documented in the history of present illness. Otherwise, unchanged from my initial visit note.  Past Medical History:  Diagnosis Date  . Attention deficit disorder   . Combined hyperlipidemia   . Goiter   . Gynecomastia, male   . Hypertension   . Noncompliance with treatment   . Obesity, Class III, BMI 40-49.9 (morbid obesity) (HCC)   . 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 Thyroiditis, autoimmune   . Type 2 diabetes mellitus in patient age 44-19 years with HbA1C goal below 7.5    Past Surgical History:  Procedure Laterality Date  . CIRCUMCISION     Social History   Social History  . Marital status: Single    Spouse name: N/A  . Number of children: 0   Occupational History  . Company secretary  Social History Main Topics  . Smoking status: Never Smoker  . Smokeless tobacco: Never Used  . Alcohol use No  . Drug use: No   Social History Narrative   Lives with mom and dad and brother.   Current Outpatient Medications on File Prior to Visit  Medication Sig Dispense Refill  . Brexpiprazole (REXULTI PO) Take 10 mg by mouth.    . cephALEXin (KEFLEX) 500 MG capsule Take 1 capsule (500 mg total) 3 (three) times daily by mouth. 30 capsule 2  . Continuous Blood Gluc Receiver (FREESTYLE LIBRE READER) DEVI 1 Device by Does not apply route 3 (three) times daily. 1 Device 1  . Continuous Blood Gluc Sensor  (FREESTYLE LIBRE SENSOR SYSTEM) MISC 1 Device by Does not apply route every 30 (thirty) days. 3 each 11  . dexmethylphenidate (FOCALIN XR) 20 MG 24 hr capsule Take 20 mg by mouth daily.    Marland Kitchen. FARXIGA 10 MG TABS tablet TAKE 1 TABLET EVERY DAY 30 tablet 3  . glucose blood (ONETOUCH VERIO) test strip USE TO CHECK BLOOD SUGAR 6 TIMES DAILY 200 each 6  . metFORMIN (GLUCOPHAGE) 1000 MG tablet Take 1 tablet (1,000 mg total) 2 (two) times daily with a meal by mouth. 60 tablet 6  . ONETOUCH DELICA LANCETS 33G MISC Use to check sugar 6 times daily 600 each 5   No current facility-administered medications on file prior to visit.    Allergies  Allergen Reactions  . Citrus     Pt stated, "breaks out in blisters on fingers"   Family History  Problem Relation Age of Onset  . Obesity Mother   . Diabetes Maternal Aunt   . Obesity Maternal Aunt   . Diabetes Maternal Grandmother   . Obesity Maternal Grandmother   . Cancer Neg Hx    PE: There were no vitals taken for this visit. Wt Readings from Last 3 Encounters:  03/06/17 278 lb (126.1 kg)  02/07/17 279 lb (126.6 kg)  04/27/16 269 lb 3.2 oz (122.1 kg)   Constitutional: overweight, in NAD Eyes: PERRLA, EOMI, no exophthalmos ENT: moist mucous membranes, no thyromegaly, no cervical lymphadenopathy Cardiovascular: RRR, No MRG Respiratory: CTA B Gastrointestinal: abdomen soft, NT, ND, BS+ Musculoskeletal: no deformities, strength intact in all 4 Skin: moist, warm Neurological: no tremor with outstretched hands, DTR normal in all 4  ASSESSMENT: 1. DM2, non-insulin-dependent, uncontrolled, without long term complications, but with hyperglycemia  2. Obesity class 2 BMI Classification:  < 18.5 underweight   18.5-24.9 normal weight   25.0-29.9 overweight   30.0-34.9 class I obesity   35.0-39.9 class II obesity   ? 40.0 class III obesity   PLAN:  1. Patient with long-standing, uncontrolled diabetes, on oral antidiabetic regimen with  Marcelline DeistFarxiga and now with an increased dose of metformin since last visit.   - At last visit, his HbA1c was better, at 7.3%, but still elevated.  He was eating mostly fast foods or highly processed meals and we discussed about improving his diet as a mandatory factor for improving his diabetes.  I also referred him to nutrition. -  - I suggested to:  Patient Instructions  Please continue: - Farxiga 10 mg daily in the pm - Metformin 1000 mg twice a day with meals  Please return in 3 months with your sugar log.   - today, HbA1c is 7%  - continue checking sugars at different times of the day - check 1x a day, rotating checks - advised for yearly eye  exams >> he is UTD  - At last visit, I suggested to  see a podiatrist due to multiple foot skin pbs (+ B feet onychodystrophy, dyshidrotic eczema and calluses) - Given flu shot at last visit - Return to clinic in 3 mo with sugar log   2. Obesity class 2 - At last visit, we discussed about the need to improve diet and I also referred him to nutrition - We can also use a GLP1 R agonist in the future.  [employer] should also help.  Carlus Pavlovristina Chidiebere Wynn, MD PhD [facility]

## 2017-04-12 ENCOUNTER — Encounter: Payer: Self-pay | Admitting: Internal Medicine

## 2017-04-12 ENCOUNTER — Ambulatory Visit: Payer: BLUE CROSS/BLUE SHIELD | Admitting: Internal Medicine

## 2017-04-12 VITALS — BP 102/60 | HR 79 | Ht 73.0 in | Wt 278.8 lb

## 2017-04-12 DIAGNOSIS — E1165 Type 2 diabetes mellitus with hyperglycemia: Secondary | ICD-10-CM | POA: Diagnosis not present

## 2017-04-12 LAB — LIPID PANEL
CHOL/HDL RATIO: 3
Cholesterol: 174 mg/dL (ref 0–200)
HDL: 49.9 mg/dL (ref >39.00)
LDL Cholesterol: 106 mg/dL — ABNORMAL HIGH (ref 0–99)
NonHDL: 123.97
TRIGLYCERIDES: 88 mg/dL (ref 0.0–149.0)
VLDL: 17.6 mg/dL (ref 0.0–40.0)

## 2017-04-12 LAB — MICROALBUMIN / CREATININE URINE RATIO
CREATININE, U: 85.5 mg/dL
Microalb Creat Ratio: 0.8 mg/g (ref 0.0–30.0)

## 2017-04-12 MED ORDER — SITAGLIPTIN PHOSPHATE 100 MG PO TABS: 100.0000 mg | ORAL_TABLET | Freq: Every day | ORAL | 5 refills | Status: DC

## 2017-04-12 MED ORDER — DAPAGLIFLOZIN PROPANEDIOL 10 MG PO TABS: 10.0000 mg | ORAL_TABLET | Freq: Every day | ORAL | 3 refills | Status: DC

## 2017-04-12 NOTE — Progress Notes (Signed)
Patient ID: Jared Potts, male   DOB: 06/11/1995, 21 y.o.   MRN: [id number]   HPI: Jared Potts is a 21 y.o.-year-old male, initially referred by his PCP, Dr. Loleta Potts, now returning for follow-up for DM2, dx in 2010, non-insulin-dependent, uncontrolled, without long term complications. He saw Dr. Vanessa Sandy Potts before. Last visit with me 2 mo ago.  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  He has a freestyle libre CGM.  He started on this 2 weeks ago.  He likes it.  Last hemoglobin A1c was: Lab Results  Component Value Date   HGBA1C 7.3 02/07/2017   HGBA1C 8.8 04/27/2016   HGBA1C 6.7 01/03/2016   Pt is on a regimen of: - Metformin 1000 mg 1x a day daily in pm >> 2x a day with meals - Invokana >> Farxiga 10 mg daily- added 2017 He was on [name] before >> stopped 2017 per insurance coverage  Sugars are usually higher after meals and controlled when sleeping during the day.  We downloaded his CGM reports and reviewed them together.  We also downloaded his meter, however, he only had 5 values in the last month.  Lowest sugar was 90 >> 90s; he has hypoglycemia awareness at 90s.  Highest sugar was 272 >> 200s.  CGM parameters: - Average from CGM: 163+/-66 - Average from manual BG checks: 189+/-86  Time in range:  - low (<70): 2% - normal range (70-180): 66% - high sugars (>180):32%  He works 2 jobs: [employer]: 5 pm - 10 pm then 11 pm - 7:30 am   Glucometer: One [employer] IQ  Pt's meals are: - Breakfast: chicken biscuit ([place]) - Lunch: skips - Dinner: hot pockets, PB and jelly, pizza, apple sauce, [place] bar - Snacks: protein drinks  - No CKD, last BUN/creatinine:  Lab Results  Component Value Date   BUN 16 04/27/2016   BUN 17 04/14/2015   CREATININE 0.79 04/27/2016   CREATININE 0.83 04/14/2015   -+ Dyslipidemia; last set of lipids: Lab Results  Component Value Date   CHOL 167 04/27/2016   HDL 52 04/27/2016   LDLCALC 70 04/27/2016   TRIG 225 (H) 04/27/2016   CHOLHDL 3.2 04/27/2016   - last eye exam was in 06/2016: No DR -No numbness and tingling in his feet.  ROS: Constitutional: no weight gain/no weight loss, no fatigue, no subjective hyperthermia, no subjective hypothermia Eyes: no blurry vision, no xerophthalmia ENT: no sore throat, no nodules palpated in throat, no dysphagia, no odynophagia, no hoarseness Cardiovascular: no CP/no SOB/no palpitations/no leg swelling Respiratory: no cough/no SOB/no wheezing Gastrointestinal: no N/no V/no D/no C/no acid reflux Musculoskeletal: no muscle aches/no joint aches Skin: no rashes, no hair loss Neurological: no tremors/no numbness/no tingling/no dizziness  I reviewed pt's medications, allergies, PMH, social hx, family hx, and changes were documented in the history of present illness. Otherwise, unchanged from my initial visit note.  Past Medical History:  Diagnosis Date  . Attention deficit disorder   . Combined hyperlipidemia   . Goiter   . Gynecomastia, male   . Hypertension   . Noncompliance with treatment   . Obesity, Class III, BMI 40-49.9 (morbid obesity) (HCC)   . 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 Thyroiditis, autoimmune   . Type 2 diabetes mellitus in patient age 22-19 years with HbA1C goal below 7.5    Past Surgical History:  Procedure Laterality Date  . CIRCUMCISION     Social History   Social History  .  Marital status: Single    Spouse name: N/A  . Number of children: 0   Occupational History  . Company secretarywarehouse worker   Social History Main Topics  . Smoking status: Never Smoker  . Smokeless tobacco: Never Used  . Alcohol use No  . Drug use: No   Social History Narrative   Lives with mom and dad and brother.   Current Outpatient Medications on File Prior to Visit  Medication Sig Dispense Refill  . Brexpiprazole (REXULTI PO) Take 10 mg by mouth.    . cephALEXin (KEFLEX) 500 MG capsule Take 1 capsule (500 mg total) 3 (three) times daily by mouth. 30 capsule 2  .  Continuous Blood Gluc Receiver (FREESTYLE LIBRE READER) DEVI 1 Device by Does not apply route 3 (three) times daily. 1 Device 1  . Continuous Blood Gluc Sensor (FREESTYLE LIBRE SENSOR SYSTEM) MISC 1 Device by Does not apply route every 30 (thirty) days. 3 each 11  . dexmethylphenidate (FOCALIN XR) 20 MG 24 hr capsule Take 20 mg by mouth daily.    Marland Kitchen. FARXIGA 10 MG TABS tablet TAKE 1 TABLET EVERY DAY 30 tablet 3  . glucose blood (ONETOUCH VERIO) test strip USE TO CHECK BLOOD SUGAR 6 TIMES DAILY 200 each 6  . metFORMIN (GLUCOPHAGE) 1000 MG tablet Take 1 tablet (1,000 mg total) 2 (two) times daily with a meal by mouth. 60 tablet 6  . ONETOUCH DELICA LANCETS 33G MISC Use to check sugar 6 times daily 600 each 5   No current facility-administered medications on file prior to visit.    Allergies  Allergen Reactions  . Citrus     Pt stated, "breaks out in blisters on fingers"   Family History  Problem Relation Age of Onset  . Obesity Mother   . Diabetes Maternal Aunt   . Obesity Maternal Aunt   . Diabetes Maternal Grandmother   . Obesity Maternal Grandmother   . Cancer Neg Hx    Pt has FH of DM in MGM, aunt, cousins..  PE: BP 102/60   Pulse 79   Ht 6\' 1"  (1.854 m)   Wt 278 lb 12.8 oz (126.5 kg)   SpO2 97%   BMI 36.78 kg/m  Wt Readings from Last 3 Encounters:  04/12/17 278 lb 12.8 oz (126.5 kg)  03/06/17 278 lb (126.1 kg)  02/07/17 279 lb (126.6 kg)   Constitutional: overweight, in NAD Eyes: PERRLA, EOMI, no exophthalmos ENT: moist mucous membranes, no thyromegaly, no cervical lymphadenopathy Cardiovascular: RRR, No MRG Respiratory: CTA B Gastrointestinal: abdomen soft, NT, ND, BS+ Musculoskeletal: no deformities, strength intact in all 4 Skin: moist, warm, no rashes Neurological: no tremor with outstretched hands, DTR normal in all 4  ASSESSMENT: 1. DM2, non-insulin-dependent, uncontrolled, without long term complications, but with hyperglycemia  2. Obesity class 2 BMI  Classification:  < 18.5 underweight   18.5-24.9 normal weight   25.0-29.9 overweight   30.0-34.9 class I obesity   35.0-39.9 class II obesity   ? 40.0 class III obesity   PLAN:  1. Patient with long-standing, uncontrolled diabetes, on oral antidiabetic regimen, with Marcelline DeistFarxiga and now increased metformin dose since last visit. - At last visit, his HbA1c was better, at 7.3%, but still elevated.  He was eating mostly fast foods or highly processed foods and we discussed about improving his diet as a mandatory factor for improving his diabetes.  I also referred him to nutrition. - At this visit, per review of his CGM traces, his sugars are  still high especially after meals.  We discussed about adding a DPP 4 inhibitor, Januvia at the same time when he takes [employer] after he wakes up in the evening. - I suggested to:  Patient Instructions  Please continue: - Metformin 1000 mg 2x a day with meals - Farxiga 10 mg in pm  Please start: - Januvia 100 mg in pm  Please stop at the lab.  Please return in 3 months with your sugar log.   - continue checking sugars at different times of the day - check 1x a day, rotating checks - advised for yearly eye exams >> he is UTD - At last visit, I suggested to see a podiatrist due to multiple foot skin problems (bilateral feet onychodystrophy, dyshidrotic eczema and calluses) - Given flu shot at last visit - We will check his annual labs today. - Return to clinic in 3 mo with sugar log    2. Obesity class 2 - Last visit, we discussed about the need to improve diet and I also referred him to nutrition.  He did see nutrition since last visit. - His weight is stable and we discussed a goal of 10 pounds lost until next visit, in 3 months - We can also use a GLP1 R agonist in the future.  Marcelline DeistFarxiga should also help.  Januvia will also help him curb his appetite.  Component     Latest Ref Rng & Units 04/12/2017  Glucose     65 - 99 mg/dL 161156 (H)  BUN      7 - 25 mg/dL 10  Creatinine     0.960.60 - 1.35 mg/dL 0.450.85  GFR, Est Non African American     > OR = 60 mL/min/1.973m2 124  GFR, Est African American     > OR = 60 mL/min/1.6673m2 143  BUN/Creatinine Ratio     6 - 22 (calc) NOT APPLICABLE  Sodium     135 - 146 mmol/L 139  Potassium     3.5 - 5.3 mmol/L 4.0  Chloride     98 - 110 mmol/L 104  CO2     20 - 32 mmol/L 25  Calcium     8.6 - 10.3 mg/dL 9.6  Total Protein     6.1 - 8.1 g/dL 6.6  Albumin MSPROF     3.6 - 5.1 g/dL 4.5  Globulin     1.9 - 3.7 g/dL (calc) 2.1  AG Ratio     1.0 - 2.5 (calc) 2.1  Total Bilirubin     0.2 - 1.2 mg/dL 1.5 (H)  Alkaline phosphatase (APISO)     40 - 115 U/L 56  AST     10 - 40 U/L 15  ALT     9 - 46 U/L 15  Cholesterol     0 - 200 mg/dL 409174  Triglycerides     0.0 - 149.0 mg/dL 81.188.0  HDL Cholesterol     >39.00 mg/dL 91.4749.90  VLDL     0.0 - 82.940.0 mg/dL 56.217.6  LDL (calc)     0 - 99 mg/dL 130106 (H)  Total CHOL/HDL Ratio      3  NonHDL      123.97  Microalb, Ur     0.0 - 1.9 mg/dL <8.6<0.7  Creatinine,U     mg/dL 57.885.5  MICROALB/CREAT RATIO     0.0 - 30.0 mg/g 0.8   Carlus Pavlovristina Sparkle Aube, MD PhD [facility]

## 2017-04-12 NOTE — Patient Instructions (Signed)
Please continue: - Metformin 1000 mg 2x a day with meals - Farxiga 10 mg in pm  Please start: - Januvia 100 mg in pm  Please stop at the lab.  Please return in 3 months with your sugar log.

## 2017-04-13 LAB — COMPLETE METABOLIC PANEL WITH GFR
AG RATIO: 2.1 (calc) (ref 1.0–2.5)
ALT: 15 U/L (ref 9–46)
AST: 15 U/L (ref 10–40)
Albumin: 4.5 g/dL (ref 3.6–5.1)
Alkaline phosphatase (APISO): 56 U/L (ref 40–115)
BILIRUBIN TOTAL: 1.5 mg/dL — AB (ref 0.2–1.2)
BUN: 10 mg/dL (ref 7–25)
CALCIUM: 9.6 mg/dL (ref 8.6–10.3)
CO2: 25 mmol/L (ref 20–32)
CREATININE: 0.85 mg/dL (ref 0.60–1.35)
Chloride: 104 mmol/L (ref 98–110)
GFR, EST AFRICAN AMERICAN: 143 mL/min/{1.73_m2} (ref >=60)
GFR, Est Non African American: 124 mL/min/{1.73_m2} (ref >=60)
GLOBULIN: 2.1 g/dL (ref 1.9–3.7)
Glucose, Bld: 156 mg/dL — ABNORMAL HIGH (ref 65–99)
Potassium: 4 mmol/L (ref 3.5–5.3)
Sodium: 139 mmol/L (ref 135–146)
Total Protein: 6.6 g/dL (ref 6.1–8.1)

## 2017-06-06 ENCOUNTER — Ambulatory Visit: Payer: BLUE CROSS/BLUE SHIELD | Admitting: Skilled Nursing Facility1

## 2017-07-11 ENCOUNTER — Encounter: Payer: Self-pay | Admitting: Internal Medicine

## 2017-07-11 ENCOUNTER — Ambulatory Visit: Payer: BLUE CROSS/BLUE SHIELD | Admitting: Internal Medicine

## 2017-07-11 DIAGNOSIS — Z0289 Encounter for other administrative examinations: Secondary | ICD-10-CM

## 2017-07-18 ENCOUNTER — Telehealth: Payer: Self-pay | Admitting: Internal Medicine

## 2017-07-18 NOTE — Telephone Encounter (Signed)
Patient dismissed from [facility] by Cristina Gherghe MD, effective July 11, 2017. Dismissal letter sent out by certified / registered mail.  °daj °

## 2017-07-22 NOTE — Telephone Encounter (Signed)
Received signed domestic return receipt verifying delivery of certified letter on July 20, 2017. Article number [id number] [phone] daj

## 2017-08-05 ENCOUNTER — Ambulatory Visit: Payer: Self-pay

## 2017-08-05 ENCOUNTER — Other Ambulatory Visit: Payer: Self-pay | Admitting: Occupational Medicine

## 2017-08-05 ENCOUNTER — Ambulatory Visit (HOSPITAL_COMMUNITY)
Admission: EM | Admit: 2017-08-05 | Discharge: 2017-08-05 | Disposition: A | Discharge status: Other Acute Inpatient Hospital | Payer: BLUE CROSS/BLUE SHIELD

## 2017-08-05 DIAGNOSIS — M79671 Pain in right foot: Secondary | ICD-10-CM

## 2017-08-05 DIAGNOSIS — M79641 Pain in right hand: Secondary | ICD-10-CM

## 2017-09-12 ENCOUNTER — Other Ambulatory Visit: Payer: Self-pay | Admitting: Internal Medicine

## 2017-09-16 ENCOUNTER — Ambulatory Visit: Payer: BLUE CROSS/BLUE SHIELD | Admitting: Endocrinology

## 2017-09-16 ENCOUNTER — Other Ambulatory Visit: Payer: Self-pay

## 2017-09-16 ENCOUNTER — Other Ambulatory Visit: Payer: Self-pay | Admitting: Endocrinology

## 2017-09-16 ENCOUNTER — Telehealth: Payer: Self-pay | Admitting: Endocrinology

## 2017-09-16 ENCOUNTER — Other Ambulatory Visit (INDEPENDENT_AMBULATORY_CARE_PROVIDER_SITE_OTHER): Payer: BLUE CROSS/BLUE SHIELD

## 2017-09-16 DIAGNOSIS — E1165 Type 2 diabetes mellitus with hyperglycemia: Secondary | ICD-10-CM

## 2017-09-16 LAB — COMPREHENSIVE METABOLIC PANEL
ALBUMIN: 4.7 g/dL (ref 3.5–5.2)
ALK PHOS: 48 U/L (ref 39–117)
ALT: 19 U/L (ref 0–53)
AST: 19 U/L (ref 0–37)
BUN: 12 mg/dL (ref 6–23)
CALCIUM: 9.5 mg/dL (ref 8.4–10.5)
CO2: 26 meq/L (ref 19–32)
CREATININE: 0.8 mg/dL (ref 0.40–1.50)
Chloride: 103 mEq/L (ref 96–112)
GFR: 154.47 mL/min (ref >60.00)
Glucose, Bld: 113 mg/dL — ABNORMAL HIGH (ref 70–99)
Potassium: 3.9 mEq/L (ref 3.5–5.1)
Sodium: 138 mEq/L (ref 135–145)
TOTAL PROTEIN: 7.1 g/dL (ref 6.0–8.3)
Total Bilirubin: 1.2 mg/dL (ref 0.2–1.2)

## 2017-09-16 LAB — HEMOGLOBIN A1C: HEMOGLOBIN A1C: 6.6 % — AB (ref 4.6–6.5)

## 2017-09-16 MED ORDER — FREESTYLE LIBRE READER DEVI: 1.0000 | Freq: Three times a day (TID) | 1 refills | Status: DC

## 2017-09-16 MED ORDER — FREESTYLE LIBRE SENSOR SYSTEM MISC: 1.0000 | 11 refills | Status: DC

## 2017-09-16 NOTE — Telephone Encounter (Signed)
Which meter would you like to prescribe?

## 2017-09-16 NOTE — Telephone Encounter (Signed)
Depends on what his insurance covers

## 2017-09-16 NOTE — Telephone Encounter (Signed)
Patient was called and asked what type of meter he was currently using. Patient stated that he needed a [employer]. New Freestyle Josephine Igo was sent to pharmacy requested along with the sensors. Patient informed this was sent.

## 2017-09-16 NOTE — Telephone Encounter (Signed)
Patient needs new RX for glucose meter sent to [facility] on [address] asap. Also he needs a case to put it in.

## 2017-09-19 NOTE — Progress Notes (Deleted)
err

## 2017-09-20 ENCOUNTER — Encounter: Payer: Self-pay | Admitting: Endocrinology

## 2017-09-20 ENCOUNTER — Ambulatory Visit: Payer: BLUE CROSS/BLUE SHIELD | Admitting: Endocrinology

## 2017-09-20 DIAGNOSIS — Z0289 Encounter for other administrative examinations: Secondary | ICD-10-CM

## 2017-09-25 ENCOUNTER — Telehealth: Payer: Self-pay | Admitting: Endocrinology

## 2017-09-25 NOTE — Telephone Encounter (Signed)
Patient dismissed from [facility] by Reather Littler MD , effective Sep 20, 2017. Dismissal letter sent out by certified / registered mail.  daj

## 2017-10-02 NOTE — Telephone Encounter (Signed)
Received signed domestic return receipt verifying delivery of certified letter on Sep 27, 2017. Article number [id number] [phone] daj

## 2018-03-09 ENCOUNTER — Encounter: Payer: Self-pay | Admitting: Emergency Medicine

## 2018-03-09 DIAGNOSIS — F4010 Social phobia, unspecified: Secondary | ICD-10-CM | POA: Insufficient documentation

## 2018-03-09 DIAGNOSIS — G4726 Circadian rhythm sleep disorder, shift work type: Secondary | ICD-10-CM | POA: Insufficient documentation

## 2018-03-26 ENCOUNTER — Ambulatory Visit: Payer: Self-pay | Admitting: Psychiatry

## 2018-03-27 ENCOUNTER — Ambulatory Visit: Payer: Self-pay | Admitting: Psychiatry

## 2018-03-31 ENCOUNTER — Ambulatory Visit (INDEPENDENT_AMBULATORY_CARE_PROVIDER_SITE_OTHER): Payer: BLUE CROSS/BLUE SHIELD | Admitting: Psychiatry

## 2018-03-31 ENCOUNTER — Encounter: Payer: Self-pay | Admitting: Psychiatry

## 2018-03-31 VITALS — BP 110/86 | HR 84 | Ht 73.0 in | Wt 284.0 lb

## 2018-03-31 DIAGNOSIS — F250 Schizoaffective disorder, bipolar type: Secondary | ICD-10-CM

## 2018-03-31 DIAGNOSIS — F902 Attention-deficit hyperactivity disorder, combined type: Secondary | ICD-10-CM

## 2018-03-31 DIAGNOSIS — F4010 Social phobia, unspecified: Secondary | ICD-10-CM

## 2018-03-31 MED ORDER — DEXMETHYLPHENIDATE HCL ER 30 MG PO CP24: 30.0000 mg | ORAL_CAPSULE | Freq: Two times a day (BID) | ORAL | 0 refills | Status: DC

## 2018-03-31 MED ORDER — DEXMETHYLPHENIDATE HCL ER 30 MG PO CP24: 1.0000 | ORAL_CAPSULE | Freq: Two times a day (BID) | ORAL | 0 refills | Status: DC

## 2018-03-31 MED ORDER — BREXPIPRAZOLE 4 MG PO TABS: 4.0000 mg | ORAL_TABLET | Freq: Every day | ORAL | 1 refills | Status: DC

## 2018-03-31 NOTE — Progress Notes (Signed)
[facility]  Patient ID: Jared Potts,  MRN: [id number]  PCP: Mirna Mires, MD  Date of Evaluation: 03/31/2018 Time spent:20 minutes  Chief Complaint:  Chief Complaint    ADHD; Anxiety; Agitation      HISTORY/CURRENT STATUS: Jared Potts is seen individually and mother included by face time on cell phone conjointly face-to-face with consent without collateral for psychiatric interview and exam in 23-month evaluation and management of schizoaffective and ADHD, though patient emphasizes need to address his anxiety again.  Mother is concerned with his facial expression of irritable anger as if schizoaffective is pre-delusional, noting that his mood has not been either manic or depressed though he has been cognitively stressed.  He initially states he is working 2 jobs but then by the end of the session acknowledges that he quit one job and now works only for [employer].  He still looks forward to his girlfriend who is having her 23[age] birthday today.He does not currently have endocrinologist for diabetes having exhausted his care by non-compliance now stating he is no longer insulin dependent.  We have faced the same noncompliance here the last week as patient has missed 2 appointments stating he slept through calling in late such that reception called the patient early today and left message for mother to facilitate his arrival.  His difficulties including losing his endocrinology care such behavior raise again questions more for pre-delusional behavior than anxiety, though both are in the differential well as the ADHD.  Mother considers the patient compliant with the 3 mg Rexulti daily and the 30 mg XR Focalin twice daily, patient showing me his change containing Focalin in a small pillbox.   Anxiety  Presents for follow-up visit. Symptoms include confusion, decreased concentration, irritability, nervous/anxious behavior and obsessions. Patient reports no depressed mood, hyperventilation,  insomnia, nausea or suicidal ideas. Symptoms occur most days. The most recent episode lasted 45 minutes. The severity of symptoms is interfering with daily activities. The patient sleeps 5 hours per night. The quality of sleep is fair. Nighttime awakenings: one to two.   Compliance with medications is 51-75%.    Individual Medical History/ Review of Systems: Changes? :Yes .  Diabetes care has been interrupted by the patient's noncompliance changing his diabetes format from independent to non-insulin-dependent most recent labs Sep 16, 2017 with glucose 113 after random last December of 156, MP otherwise normal, hemoglobin A1c 6.6% down from a year ago 7.3% and lipids 1 year ago with LDL cholesterol slightly elevated at 106 though total normal at 174, triglycerides 88, and HDL 49.9.  Allergies: Citrus  Current Medications:  Current Outpatient Medications:  .  Dexmethylphenidate HCl 30 MG CP24, Take 30 mg by mouth 2 (two) times daily., Disp: , Rfl:  .  Brexpiprazole (REXULTI) 4 MG TABS, Take 4 mg by mouth daily., Disp: 90 tablet, Rfl: 1 .  cephALEXin (KEFLEX) 500 MG capsule, Take 1 capsule (500 mg total) 3 (three) times daily by mouth., Disp: 30 capsule, Rfl: 2 .  Continuous Blood Gluc Receiver (FREESTYLE LIBRE READER) DEVI, 1 Device by Does not apply route 3 (three) times daily., Disp: 1 Device, Rfl: 1 .  Continuous Blood Gluc Sensor (FREESTYLE LIBRE SENSOR SYSTEM) MISC, 1 Device by Does not apply route every 30 (thirty) days., Disp: 3 each, Rfl: 11 .  dapagliflozin propanediol (FARXIGA) 10 MG TABS tablet, Take 10 mg by mouth daily., Disp: 90 tablet, Rfl: 3 .  Dexmethylphenidate HCl 30 MG CP24, Take 1 capsule (30 mg total) by mouth  2 (two) times daily., Disp: 60 capsule, Rfl: 0 .  [START ON 04/30/2018] Dexmethylphenidate HCl 30 MG CP24, Take 1 capsule (30 mg total) by mouth 2 (two) times daily., Disp: 60 capsule, Rfl: 0 .  [START ON 05/30/2018] Dexmethylphenidate HCl 30 MG CP24, Take 1 capsule (30 mg  total) by mouth 2 (two) times daily., Disp: 60 capsule, Rfl: 0 .  glucose blood (ONETOUCH VERIO) test strip, USE TO CHECK BLOOD SUGAR 6 TIMES DAILY, Disp: 200 each, Rfl: 6 .  metFORMIN (GLUCOPHAGE) 1000 MG tablet, Take 1 tablet (1,000 mg total) 2 (two) times daily with a meal by mouth., Disp: 60 tablet, Rfl: 6 .  ONETOUCH DELICA LANCETS 33G MISC, Use to check sugar 6 times daily, Disp: 600 each, Rfl: 5 .  sitaGLIPtin (JANUVIA) 100 MG tablet, Take 1 tablet (100 mg total) by mouth daily., Disp: 30 tablet, Rfl: 5 Medication Side Effects: none except worry for Rexulti being potentially capable of increasing glucose lipids along with weight which however is stable.  Family Medical/ Social History: Changes? Yes.  Patient suggest he lives at home with mother but often visits girlfriend [place] is 22 years of age today.  Mother has depression in brother and maternal grandmother have schizoaffective though less severely than patient.  MENTAL HEALTH EXAM: Muscle strength 5/5, postural reflexes 0/0, and AIMS equals 0 Blood pressure 110/86, pulse 84, height 6\' 1"  (1.854 m), weight 284 lb (128.8 kg).Body mass index is 37.47 kg/m.  General Appearance: Disheveled, Guarded and Obese  Eye Contact:  Fair  Speech:  Blocked and Clear and Coherent  Volume:  Normal  Mood:  Anxious, Euthymic and Worthless  Affect:  Constricted, Inappropriate and Anxious  Thought Process:  Disorganized and Linear  Orientation:  Full (Time, Place, and Person)  Thought Content: Delusions weekly pre-delusional illusions  Suicidal Thoughts:  No  Homicidal Thoughts:  No  Memory:  Immediate;   Fair  Judgement:  Fair  Insight:  Lacking  Psychomotor Activity:  Increased and Decreased  Concentration:  Concentration: Fair and Attention Span: Poor  Recall:  [employer]  Fund of Knowledge: Fair  Language: Fair  Assets:  Financial Resources/Insurance Housing Talents/Skills  ADL's:  Intact  Cognition: WNL  Prognosis:  Poor     DIAGNOSES:    ICD-10-CM   1. Schizoaffective disorder, bipolar type (HCC) F25.0 Brexpiprazole (REXULTI) 4 MG TABS  2. Attention deficit hyperactivity disorder (ADHD), combined type, moderate F90.2 Dexmethylphenidate HCl 30 MG CP24    Dexmethylphenidate HCl 30 MG CP24    Dexmethylphenidate HCl 30 MG CP24  3. Social anxiety disorder F40.10 Brexpiprazole (REXULTI) 4 MG TABS    Receiving Psychotherapy: No    RECOMMENDATIONS: All issues are mobilized relative to contributions to increasing noncompliance in the last 3 to 6 months with medical and mental consequences.  At this time we increase Rexulti to 4 mg daily in the morning and continue Focalin 30 mg XR twice daily prescribed as a month supply each for Focalin for November, December, and January and Rexulti for a month supply and [facility] [address].  Additional management of anxiety could include BuSpar or Neurontin but at this time increasing Rexulti may be sufficient.  Mother is reintegrated into her observation and training as well as direction for the patient.  He returns in 3 months.   Chauncey MannGlenn E Jennings, MD

## 2018-04-16 ENCOUNTER — Other Ambulatory Visit: Payer: Self-pay | Admitting: Internal Medicine

## 2018-05-27 ENCOUNTER — Other Ambulatory Visit: Payer: Self-pay | Admitting: Internal Medicine

## 2018-05-27 DIAGNOSIS — E119 Type 2 diabetes mellitus without complications: Secondary | ICD-10-CM

## 2018-06-26 ENCOUNTER — Encounter: Payer: Self-pay | Admitting: Psychiatry

## 2018-06-26 ENCOUNTER — Ambulatory Visit (INDEPENDENT_AMBULATORY_CARE_PROVIDER_SITE_OTHER): Payer: BLUE CROSS/BLUE SHIELD | Admitting: Psychiatry

## 2018-06-26 VITALS — BP 126/80 | HR 68 | Ht 73.0 in | Wt 285.0 lb

## 2018-06-26 DIAGNOSIS — G4726 Circadian rhythm sleep disorder, shift work type: Secondary | ICD-10-CM | POA: Diagnosis not present

## 2018-06-26 DIAGNOSIS — F250 Schizoaffective disorder, bipolar type: Secondary | ICD-10-CM

## 2018-06-26 DIAGNOSIS — F4010 Social phobia, unspecified: Secondary | ICD-10-CM

## 2018-06-26 DIAGNOSIS — F902 Attention-deficit hyperactivity disorder, combined type: Secondary | ICD-10-CM

## 2018-06-26 MED ORDER — DEXMETHYLPHENIDATE HCL ER 30 MG PO CP24: 30.0000 mg | ORAL_CAPSULE | Freq: Two times a day (BID) | ORAL | 0 refills | Status: DC

## 2018-06-26 MED ORDER — DEXMETHYLPHENIDATE HCL ER 30 MG PO CP24: 1.0000 | ORAL_CAPSULE | Freq: Two times a day (BID) | ORAL | 0 refills | Status: DC

## 2018-06-26 MED ORDER — BREXPIPRAZOLE 4 MG PO TABS: 4.0000 mg | ORAL_TABLET | Freq: Every day | ORAL | 0 refills | Status: DC

## 2018-06-26 NOTE — Progress Notes (Signed)
[facility]  Patient ID: Jared Potts,  MRN: [id number]  PCP: Mirna Mires, MD  Date of Evaluation: 06/26/2018 Time spent:20 minutes  Chief Complaint:  Chief Complaint    ADHD; Depression; Manic Behavior; Hallucinations; Agitation      HISTORY/CURRENT STATUS: Jared Potts is seen conjointly with mother face-to-face with consent not collateral for psychiatric interview and exam in 96-month evaluation and management of schizoaffective bipolar, ADHD, social anxiety disorder, and circadian rhythm sleep disorder shiftwork type now out of his second job.  At last appointment despite mother joining on face time, she was at work and it was not possible to clearly understand how patient needed to leave the job at [employer], whether for his own good reasons or if he was having problems with mood and cognitive function that contributed to having to leave.  Patient reported then needing another job, and mother expressed concern that his recent anger and strained look on his face reminded her of previous relapses.  Rexulti was increased from 3 to 4 mg daily at that time, and he continues Focalin that helps him a lot, though he is taking only 1 dose a day now as he is working only 1 job and not the second job that requires the extra dose.  Still he hopes to have the extra job soon and will need the Focalin second dose then.  The [place] registry of controlled substance notes fill of only the 1 prescription 03/31/2018 and not the subsequent 2 written then for December and January each for #60.  Mother does state today that the patient has difficulty attending church when she is not with him, as he seems to disorganize when members of the church with whom he is not is familiar do or talk as if in the spirit, though he copes well when with mother.  Girlfriend cannot help with that comfort in church when she attends with him. The patient is social with peers in the lobby today for the first time noted.     Depression         The patient presents with no depression.  This is a recurrent problem.  The current episode started more than 1 year ago.   The onset quality is sudden.   The problem occurs intermittently.  The problem has been gradually improving since onset.  Associated symptoms include decreased concentration, fatigue, helplessness and insomnia.  Associated symptoms include no hopelessness, not irritable, no decreased interest, no body aches, no headaches, no indigestion, not sad and no suicidal ideas.     The symptoms are aggravated by work stress, family issues, medication and social issues.  Past treatments include other medications and psychotherapy.  Past compliance problems include difficulty with treatment plan, medication issues and difficulty understanding directions.  Risk factors include a change in medication usage/dosage, family history of mental illness and family violence.   Past medical history includes anxiety, bipolar disorder, eating disorder and mental health disorder.     Pertinent negatives include no physical disability, no recent psychiatric admission, no depression, no obsessive-compulsive disorder, no post-traumatic stress disorder, no schizophrenia, no suicide attempts and no head trauma.   Individual Medical History/ Review of Systems: Changes? :Yes In the 12-month interval since last appointment, the patient has not found an endocrinologist or had any assessment or care for his diabetes.  His continues established medications with last Epic A1c 6.6 in May 2019 having been 7.3 the year before. He intends to obtain endocrinologist and inquires curiously  whether I have an endocrinologist as though projecting that I have diabetes also.  He does not misuse the Focalin in any way,and weight is stable up 1 pound from the last 2 appointments.  He exercises in the gym which helps his diabetes and also his stress so that he feels and functions better.  Allergies: Citrus  Current  Medications:  Current Outpatient Medications:  .  Brexpiprazole (REXULTI) 4 MG TABS, Take 4 mg by mouth daily., Disp: 90 tablet, Rfl: 1 .  cephALEXin (KEFLEX) 500 MG capsule, Take 1 capsule (500 mg total) 3 (three) times daily by mouth., Disp: 30 capsule, Rfl: 2 .  Continuous Blood Gluc Receiver (FREESTYLE LIBRE READER) DEVI, 1 Device by Does not apply route 3 (three) times daily., Disp: 1 Device, Rfl: 1 .  Continuous Blood Gluc Sensor (FREESTYLE LIBRE SENSOR SYSTEM) MISC, 1 Device by Does not apply route every 30 (thirty) days., Disp: 3 each, Rfl: 11 .  dapagliflozin propanediol (FARXIGA) 10 MG TABS tablet, Take 10 mg by mouth daily., Disp: 90 tablet, Rfl: 3 .  Dexmethylphenidate HCl 30 MG CP24, Take 30 mg by mouth 2 (two) times daily., Disp: , Rfl:  .  Dexmethylphenidate HCl 30 MG CP24, Take 1 capsule (30 mg total) by mouth 2 (two) times daily., Disp: 60 capsule, Rfl: 0 .  Dexmethylphenidate HCl 30 MG CP24, Take 1 capsule (30 mg total) by mouth 2 (two) times daily., Disp: 60 capsule, Rfl: 0 .  Dexmethylphenidate HCl 30 MG CP24, Take 1 capsule (30 mg total) by mouth 2 (two) times daily., Disp: 60 capsule, Rfl: 0 .  glucose blood (ONETOUCH VERIO) test strip, USE TO CHECK BLOOD SUGAR 6 TIMES DAILY, Disp: 200 each, Rfl: 6 .  metFORMIN (GLUCOPHAGE) 1000 MG tablet, Take 1 tablet (1,000 mg total) 2 (two) times daily with a meal by mouth., Disp: 60 tablet, Rfl: 6 .  ONETOUCH DELICA LANCETS 33G MISC, Use to check sugar 6 times daily, Disp: 600 each, Rfl: 5 .  sitaGLIPtin (JANUVIA) 100 MG tablet, Take 1 tablet (100 mg total) by mouth daily., Disp: 30 tablet, Rfl: 5   Medication Side Effects: none  Family Medical/ Social History: Changes? Yes, now having his 3 year anniversary of dating girlfriend on Valentine's Day as they spent time together celebrating.  Mother would previously suggest the girlfriend was a stress for the patient as she likely had other people she was dating.  Patient does well driving  attentively with skill, including attested to by mother when she rides with him infrequently.  Mother notes that family dog helps the patient feel more secure.   MENTAL HEALTH EXAM: Muscle strengths and tone 5/5, postural reflexes and gait 0/0, and AIMS = 0. Blood pressure 126/80, pulse 68, height 6\' 1"  (1.854 m), weight 285 lb (129.3 kg).Body mass index is 37.6 kg/m.  General Appearance: Casual, Fairly Groomed and Obese  Eye Contact:  Fair  Speech:  Garbled, Slow and Talkative  Volume:  Normal  Mood:  Anxious, Euthymic and Worthless  Affect:  Congruent, Labile, Full Range and Anxious  Thought Process:  Irrelevant and Linear  Orientation:  Full (Time, Place, and Person)  Thought Content: Illogical, Ilusions and Rumination   Suicidal Thoughts:  No  Homicidal Thoughts:  No  Memory:  Immediate;   Good Remote;   Fair  Judgement:  Fair  Insight:  Fair and Lacking  Psychomotor Activity:  Decreased  Concentration:  Concentration: Fair and Attention Span: Fair  Recall:  [employer]  Fund  of Knowledge: Fair  Language: Fair  Assets:  Leisure Time Resilience Talents/Skills  ADL's:  Intact  Cognition: WNL  Prognosis:  Fair    DIAGNOSES:    ICD-10-CM   1. Schizoaffective disorder, bipolar type (HCC) F25.0   2. Attention deficit hyperactivity disorder (ADHD), combined type, moderate F90.2   3. 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      Social anxiety disorder             F40.10  Receiving Psychotherapy: No    RECOMMENDATIONS: Mother and patient are updated upon metabolic monitoring with Rexulti that has been best done by endocrinologist providing diabetes care in the past as those numbers work for his Rexulti as well.  I enourage and facilitate obtaining an adult endocrinologist, so reinforcing the benefits and praising his efforts in the gym exercising.  Medications are appropriate though additional treatment is possible if needed for mood or cognition as educated  today.  [facility] [address] for the next 3 months uncertain of the status if any of remaining unfilled E scripts from last appointment 03/31/2018 though extra E scripts now would simply extend the availability as needed in the future should he get a second job or as mother favors him coming in for appointments every 6 months but accepting the supportive therapy and prevention possible by coming currently every 3 months that patient chooses.  Focalin is 30 mg XR twice daily #60 each for February, March, and April for ADHD.  Rexulti is 4 mg every morning #90 with no refill to extend the 90 day [facility] for schizoaffective disorder and social anxiety.   Chauncey Mann, MD

## 2018-09-09 ENCOUNTER — Other Ambulatory Visit: Payer: Self-pay | Admitting: Internal Medicine

## 2018-09-09 DIAGNOSIS — E119 Type 2 diabetes mellitus without complications: Secondary | ICD-10-CM

## 2018-09-25 ENCOUNTER — Encounter: Payer: Self-pay | Admitting: Psychiatry

## 2018-09-25 ENCOUNTER — Ambulatory Visit (INDEPENDENT_AMBULATORY_CARE_PROVIDER_SITE_OTHER): Payer: BLUE CROSS/BLUE SHIELD | Admitting: Psychiatry

## 2018-09-25 ENCOUNTER — Other Ambulatory Visit: Payer: Self-pay

## 2018-09-25 DIAGNOSIS — F902 Attention-deficit hyperactivity disorder, combined type: Secondary | ICD-10-CM

## 2018-09-25 DIAGNOSIS — G4726 Circadian rhythm sleep disorder, shift work type: Secondary | ICD-10-CM

## 2018-09-25 DIAGNOSIS — F4010 Social phobia, unspecified: Secondary | ICD-10-CM

## 2018-09-25 DIAGNOSIS — F250 Schizoaffective disorder, bipolar type: Secondary | ICD-10-CM

## 2018-09-25 MED ORDER — DEXMETHYLPHENIDATE HCL ER 30 MG PO CP24: 30.0000 mg | ORAL_CAPSULE | Freq: Two times a day (BID) | ORAL | 0 refills | Status: DC

## 2018-09-25 MED ORDER — BREXPIPRAZOLE 4 MG PO TABS: 4.0000 mg | ORAL_TABLET | Freq: Every day | ORAL | 0 refills | Status: DC

## 2018-09-25 NOTE — Progress Notes (Signed)
[facility]  Patient ID: Jared Potts,  MRN: [id number]  PCP: Mirna MiresHill, Gerald, MD  Date of Evaluation: 09/25/2018 Time spent:20 minutes from 1040 to 1100  Chief Complaint:  Chief Complaint    Follow-up; ADHD; Depression; Anxiety      HISTORY/CURRENT STATUS: Jared Potts is provided telemedicine audiovisual appointment session, declining the video camera due to social anxiety, with consent without collateral for psychiatric interview and exam in 5531-month evaluation and management for schizoaffective bipolar, social anxiety, and ADHD. The treatment of each disorder can be complicating for the other though stabilization of all 3 is most important for work and social/family life.  He seems more comfortable residing at family home still dating girlfriend.  He has secured a second job at [employer] after leaving [employer] as of last appointment so that he is still working with [employer].  Mother is at work today having attended the last appointment with him.  Rexulti was increased from 3 to 4 mg daily 6 months ago due to disruptive mood symptoms with more psychotic symptoms outwardly though is hesitant to acknowledge his symptoms from inward work review.  He was observed by mother to have difficulty attending church, socializing with the family, and getting things started 6 months ago that now is going better for him.  Social anxiety is not significant today.  [place] registry documents Focalin fills of February and last on March 17, pharmacy seeming to not always log each fill though with auto expiration of 6 months and new E scription possibly canceling previous.  Today, he has not mania or depression, no dissociation or delirium, and no suicidality or homicidality.   Depression         The patient presents with no depression.  This is a recurrent problem.  The current episode started more than 1 year ago.   The onset quality is sudden.   The problem occurs intermittently.  The problem has  been gradually improving since onset.  Associated symptoms include decreased concentration, fatigue, helplessness and insomnia.  Associated symptoms include no hopelessness, not irritable, no decreased interest, no body aches, no headaches, no indigestion, not sad and no suicidal ideas.     The symptoms are aggravated by work stress, family issues, medication and social issues.  Past treatments include other medications and psychotherapy.  Past compliance problems include difficulty with treatment plan, medication issues and difficulty understanding directions.  Risk factors include a change in medication usage/dosage, family history of mental illness and family violence.   Past medical history includes anxiety, bipolar disorder, eating disorder and mental health disorder.     Pertinent negatives include no physical disability, no recent psychiatric admission, no depression, no obsessive-compulsive disorder, no post-traumatic stress disorder, no schizophrenia, no suicide attempts and no head trauma.  Individual Medical History/ Review of Systems: Changes? :No He again reports not yet finding an endocrinologist for his diabetes mellitus with last CMP 1 year ago with hemoglobin A1c 6.6.  Allergies: Citrus  Current Medications:  Current Outpatient Medications:  .  Brexpiprazole (REXULTI) 4 MG TABS, Take 4 mg by mouth daily., Disp: 90 tablet, Rfl: 0 .  Continuous Blood Gluc Receiver (FREESTYLE LIBRE READER) DEVI, 1 Device by Does not apply route 3 (three) times daily., Disp: 1 Device, Rfl: 1 .  Continuous Blood Gluc Sensor (FREESTYLE LIBRE SENSOR SYSTEM) MISC, 1 Device by Does not apply route every 30 (thirty) days., Disp: 3 each, Rfl: 11 .  dapagliflozin propanediol (FARXIGA) 10 MG TABS tablet, Take  10 mg by mouth daily., Disp: 90 tablet, Rfl: 3 .  Dexmethylphenidate HCl 30 MG CP24, Take 1 capsule (30 mg total) by mouth 2 (two) times daily for 30 days., Disp: 60 capsule, Rfl: 0 .  glucose blood (ONETOUCH  VERIO) test strip, USE TO CHECK BLOOD SUGAR 6 TIMES DAILY, Disp: 200 each, Rfl: 6 .  metFORMIN (GLUCOPHAGE) 1000 MG tablet, Take 1 tablet (1,000 mg total) 2 (two) times daily with a meal by mouth., Disp: 60 tablet, Rfl: 6 .  ONETOUCH DELICA LANCETS 33G MISC, Use to check sugar 6 times daily, Disp: 600 each, Rfl: 5 .  sitaGLIPtin (JANUVIA) 100 MG tablet, Take 1 tablet (100 mg total) by mouth daily., Disp: 30 tablet, Rfl: 5 Medication Side Effects: none  Family Medical/ Social History: Changes? No  MENTAL HEALTH EXAM:  There were no vitals taken for this visit.There is no height or weight on file to calculate BMI.  As not present here today.  General Appearance: N/A  Eye Contact:  N/A  Speech:  Clear and Coherent, Garbled, Normal Rate and Talkative  Volume:  Normal  Mood:  Euthymic and Worthless  Affect:  Inappropriate and Full Range  Thought Process:  Coherent, Irrelevant and Linear  Orientation:  Full (Time, Place, and Person)  Thought Content: Logical, Paranoid Ideation and Rumination   Suicidal Thoughts:  No  Homicidal Thoughts:  No  Memory:  Immediate;   Good Remote;   Good  Judgement:  Fair  Insight:  Fair and Lacking  Psychomotor Activity:  Normal, Increased and Mannerisms  Concentration:  Concentration: Fair and Attention Span: Poor  Recall:  [employer] of Knowledge: Fair  Language: Poor  Assets:  Leisure Time Resilience Talents/Skills  ADL's:  Intact  Cognition: WNL  Prognosis:  Fair    DIAGNOSES:    ICD-10-CM   1. Schizoaffective disorder, bipolar type (HCC) F25.0   2. Attention deficit hyperactivity disorder (ADHD), combined type, moderate F90.2   3. 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 Social anxiety disorder F40.10     Receiving Psychotherapy: No   RECOMMENDATIONS: Rexulti is E scribed as 4 mg every morning after breakfast sent as #90 with no refill to [facility] for schizoaffective bipolar and social anxiety disorder.  Focalin  is E scribed 30 mg XR capsule twice daily for 60 each for May, June, and July for ADHD sent to [facility].  Patient is most compliant and functions best when he attends appointments every 3 months though emphasizing his absolute need for a more closely on his diabetes mellitus with Dr. Loleta Chance or obtaining an endocrinologist.  Labs are best monitored for Rexulti through his diabetes care.  He returns for follow-up in 3 months.  Virtual Visit via Video Note  I connected with Jared Schultze on 09/25/18 at 10:40 AM EDT by a video enabled telemedicine application and verified that I am speaking with the correct person using two identifiers.  Location: Patient: Individually at family residence Provider: [facility] office   I discussed the limitations of evaluation and management by telemedicine and the availability of in person appointments. The patient expressed understanding and agreed to proceed.  History of Present Illness: 110-month evaluation and management address schizoaffective bipolar, social anxiety, and ADHD. The treatment of each disorder can be complicating for the other though stabilization of all 3 is most important for work and social/family life.   Observations/Objective: Mood:  Euthymic and Worthless  Affect:  Inappropriate  and Full Range  Thought Process:  Coherent, Irrelevant and Linear  Orientation:  Full (Time, Place, and Person)  Thought Content: Logical, Paranoid Ideation and Rumination     Assessment and Plan: Rexulti is E scribed as 4 mg every morning after breakfast sent as #90 with no refill to [facility] for schizoaffective bipolar and social anxiety disorder.  Focalin is E scribed 30 mg XR capsule twice daily for 60 each for May, June, and July for ADHD sent to [facility].   Follow Up Instructions: Patient is most compliant and functions best when he attends appointments every 3 months though emphasizing his absolute need for a  more closely on his diabetes mellitus with Dr. Loleta Chance or obtaining an endocrinologist.  Labs are best monitored for Rexulti through his diabetes care.  He returns for follow-up in 3 months.    I discussed the assessment and treatment plan with the patient. The patient was provided an opportunity to ask questions and all were answered. The patient agreed with the plan and demonstrated an understanding of the instructions.   The patient was advised to call back or seek an in-person evaluation if the symptoms worsen or if the condition fails to improve as anticipated.  I provided 20 minutes of non-face-to-face time during this encounter. [place] WebEx meeting #[id number] Meeting password: [email or web address]  Chauncey Mann, MD   Chauncey Mann, MD

## 2018-10-19 ENCOUNTER — Other Ambulatory Visit: Payer: Self-pay | Admitting: Endocrinology

## 2018-10-26 ENCOUNTER — Other Ambulatory Visit: Payer: Self-pay | Admitting: Endocrinology

## 2018-11-17 ENCOUNTER — Encounter: Payer: Self-pay | Admitting: Emergency Medicine

## 2018-11-17 ENCOUNTER — Other Ambulatory Visit: Payer: Self-pay

## 2018-11-17 ENCOUNTER — Ambulatory Visit
Admission: EM | Admit: 2018-11-17 | Discharge: 2018-11-17 | Disposition: A | Discharge status: Home / Self Care | Payer: BC Managed Care – PPO | Attending: Physician Assistant | Admitting: Physician Assistant

## 2018-11-17 DIAGNOSIS — E119 Type 2 diabetes mellitus without complications: Secondary | ICD-10-CM | POA: Diagnosis not present

## 2018-11-17 DIAGNOSIS — R739 Hyperglycemia, unspecified: Secondary | ICD-10-CM

## 2018-11-17 LAB — POCT FASTING CBG KUC MANUAL ENTRY: POCT Glucose (KUC): 280 mg/dL — AB (ref 70–99)

## 2018-11-17 MED ORDER — FREESTYLE LIBRE SENSOR SYSTEM MISC: 1.0000 | 11 refills | Status: DC

## 2018-11-17 MED ORDER — DAPAGLIFLOZIN PROPANEDIOL 10 MG PO TABS: 10.0000 mg | ORAL_TABLET | Freq: Every day | ORAL | 3 refills | Status: DC

## 2018-11-17 MED ORDER — METFORMIN HCL 1000 MG PO TABS: 1000.0000 mg | ORAL_TABLET | Freq: Two times a day (BID) | ORAL | 6 refills | Status: DC

## 2018-11-17 NOTE — ED Notes (Signed)
Patient able to ambulate independently  

## 2018-11-17 NOTE — Discharge Instructions (Signed)
See your Physician for recheck.  °

## 2018-11-17 NOTE — ED Triage Notes (Signed)
Pt presents to [facility] for assessment of hyperglycemia.  Pt takes [place], metformin, and fariga.  Pt states he has been out of the metformin and fariga approx 2 months.  Pt c/o increased urination x 2 weeks.  Patient states he has not been able to check his sugar at home recently.

## 2018-11-18 NOTE — ED Provider Notes (Signed)
[facility]    CSN: [id number] Arrival date & time: 11/17/18  1357      History   Chief Complaint Chief Complaint  Patient presents with  . Hyperglycemia    HPI Jared Potts is a 23 y.o. male.   The history is provided by the patient. No language interpreter was used.  Hyperglycemia Blood sugar level PTA:  250 Severity:  Mild Timing:  Constant Progression:  Worsening Chronicity:  Chronic Diabetes status:  Controlled with oral medications Relieved by:  Oral agents Associated symptoms: no abdominal pain and no dizziness    Pt is out of his medications and test strips  Past Medical History:  Diagnosis Date  . Attention deficit disorder   . Combined hyperlipidemia   . Goiter   . Gynecomastia, male   . Hypertension   . Noncompliance with treatment   . Obesity, Class III, BMI 40-49.9 (morbid obesity) ([place])   . 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 Thyroiditis, autoimmune   . Type 2 diabetes mellitus in patient age 30-19 years with HbA1C goal below 7.5     Patient Active Problem List   Diagnosis Date Noted  . Circadian rhythm sleep disorder, shift work type 03/09/2018  . Type 2 diabetes mellitus with hyperglycemia, without long-term current use of insulin ([place]) 02/07/2017  . Acanthosis nigricans, acquired 11/24/2014  . Medication management 07/09/2013  . Schizoaffective disorder ([place])   . Gynecomastia, male   . Combined hyperlipidemia   . Attention deficit hyperactivity disorder (ADHD), combined type, moderate   . Obesity 09/28/2010    Past Surgical History:  Procedure Laterality Date  . CIRCUMCISION         Home Medications    Prior to Admission medications   Medication Sig Start Date End Date Taking? Authorizing Provider  Brexpiprazole (REXULTI) 4 MG TABS Take 4 mg by mouth daily. 09/25/18   Delight Hoh, MD  Continuous Blood Gluc Receiver (FREESTYLE LIBRE READER) DEVI 1 Device by Does not apply route 3 (three) times daily. 09/16/17    Elayne Snare, MD  Continuous Blood Gluc Sensor (FREESTYLE LIBRE SENSOR SYSTEM) MISC 1 Device by Does not apply route every 30 (thirty) days. 11/17/18   Fransico Meadow, PA-C  dapagliflozin propanediol (FARXIGA) 10 MG TABS tablet Take 10 mg by mouth daily. 11/17/18   Fransico Meadow, PA-C  Dexmethylphenidate HCl 30 MG CP24 Take 1 capsule (30 mg total) by mouth 2 (two) times daily for 30 days. 09/25/18 10/25/18  Delight Hoh, MD  Dexmethylphenidate HCl 30 MG CP24 Take 1 capsule (30 mg total) by mouth 2 (two) times daily for 30 days. 10/25/18 11/24/18  Delight Hoh, MD  Dexmethylphenidate HCl 30 MG CP24 Take 1 capsule (30 mg total) by mouth 2 (two) times daily for 30 days. 11/24/18 12/24/18  Delight Hoh, MD  glucose blood (ONETOUCH VERIO) test strip USE TO CHECK BLOOD SUGAR 6 TIMES DAILY 03/11/17   Philemon Kingdom, MD  metFORMIN (GLUCOPHAGE) 1000 MG tablet Take 1 tablet (1,000 mg total) by mouth 2 (two) times daily with a meal. 11/17/18   Threasa Alpha, Hollace Kinnier, PA-C  [facility] DELICA LANCETS 94W MISC Use to check sugar 6 times daily 03/11/17   Philemon Kingdom, MD  sitaGLIPtin (JANUVIA) 100 MG tablet Take 1 tablet (100 mg total) by mouth daily. 04/12/17   Philemon Kingdom, MD    Family History Family History  Problem Relation Age of Onset  . Obesity Mother   . Diabetes Maternal Aunt   .  Obesity Maternal Aunt   . Diabetes Maternal Grandmother   . Obesity Maternal Grandmother   . Cancer Neg Hx     Social History Social History   Tobacco Use  . Smoking status: Never Smoker  . Smokeless tobacco: Never Used  Substance Use Topics  . Alcohol use: No  . Drug use: No     Allergies   Citrus   Review of Systems Review of Systems  Gastrointestinal: Negative for abdominal pain.  Neurological: Negative for dizziness.  All other systems reviewed and are negative.    Physical Exam Triage Vital Signs ED Triage Vitals [11/17/18 1403]  Enc Vitals Group     BP 129/78     Pulse Rate 66      Resp 18     Temp 97.9 F (36.6 C)     Temp Source Oral     SpO2 98 %     Weight      Height      Head Circumference      Peak Flow      Pain Score 0     Pain Loc      Pain Edu?      Excl. in GC?    No data found.  Updated Vital Signs BP 129/78 (BP Location: Left Arm)   Pulse 66   Temp 97.9 F (36.6 C) (Oral)   Resp 18   SpO2 98%   Visual Acuity Right Eye Distance:   Left Eye Distance:   Bilateral Distance:    Right Eye Near:   Left Eye Near:    Bilateral Near:     Physical Exam Vitals signs and nursing note reviewed.  Constitutional:      Appearance: He is well-developed.  HENT:     Head: Normocephalic.  Neck:     Musculoskeletal: Normal range of motion.  Pulmonary:     Effort: Pulmonary effort is normal.  Abdominal:     General: There is no distension.  Musculoskeletal: Normal range of motion.  Neurological:     Mental Status: He is alert and oriented to person, place, and time.      UC Treatments / Results  Labs (all labs ordered are listed, but only abnormal results are displayed) Labs Reviewed  POCT FASTING CBG [facility] MANUAL ENTRY - Abnormal; Notable for the following components:      Result Value   POCT Glucose ([facility]) 280 (*)    All other components within normal limits    EKG   Radiology No results found.  Procedures Procedures (including critical care time)  Medications Ordered in UC Medications - No data to display  Initial Impression / Assessment and Plan / UC Course  I have reviewed the triage vital signs and the nursing notes.  Pertinent labs & imaging results that were available during my care of the patient were reviewed by me and considered in my medical decision making (see chart for details).     MDM   Pt given referral for primary care.  Pt given rx for his medications and testing equipment Final Clinical Impressions(s) / UC Diagnoses   Final diagnoses:  Hyperglycemia     Discharge Instructions     See your  Physician for recheck    ED Prescriptions    Medication Sig Dispense Auth. Provider   dapagliflozin propanediol (FARXIGA) 10 MG TABS tablet Take 10 mg by mouth daily. 90 tablet ,  K, [place]-C   metFORMIN (GLUCOPHAGE) 1000 MG tablet Take 1 tablet (  1,000 mg total) by mouth 2 (two) times daily with a meal. 60 tablet Cheron SchaumannSofia,  K, PA-C   Continuous Blood Gluc Sensor (FREESTYLE LIBRE SENSOR SYSTEM) MISC 1 Device by Does not apply route every 30 (thirty) days. 3 each Elson AreasSofia,  K, PA-C     Controlled Substance Prescriptions [place] Controlled Substance Registry consulted?  An After Visit Summary was printed and given to the patient.    Elson AreasSofia,  K, [place]-C 11/18/18 1335

## 2019-02-05 ENCOUNTER — Other Ambulatory Visit: Payer: Self-pay

## 2019-02-05 ENCOUNTER — Ambulatory Visit
Admission: EM | Admit: 2019-02-05 | Discharge: 2019-02-05 | Disposition: A | Discharge status: Home / Self Care | Payer: BC Managed Care – PPO

## 2019-02-05 DIAGNOSIS — E119 Type 2 diabetes mellitus without complications: Secondary | ICD-10-CM

## 2019-02-05 DIAGNOSIS — R112 Nausea with vomiting, unspecified: Secondary | ICD-10-CM | POA: Diagnosis not present

## 2019-02-05 NOTE — ED Provider Notes (Signed)
[facility]    CSN: [id number] Arrival date & time: 02/05/19  1320      History   Chief Complaint Chief Complaint  Patient presents with  . nausea/vomiting    HPI Jared Potts is a 23 y.o. male with history of obesity, well-controlled type 2 diabetes without long-term insulin use, hypertension presenting for nausea, vomiting over the last 1.5 months at work.  Patient works at [employer], loading and unloading packages.  Denies lightheadedness, dizziness, chest pain, shortness of breath, abdominal pain during these episodes.  Patient states sometimes will throw up: Emesis without bile or blood - happening maybe once or twice a week.  Patient reports compliance with his oral medications.  States that when he gets home he feels fine: Has been able to maintain his workout schedule at the gym without symptoms.  Patient does admit to significant financial stress: States he has wages being garnished from his [employer], and is having to work a second part-time job to pay the bills.  Denies history of depression, anxiety: Denies SI/HI.  Feels safe and supported at home.  Past Medical History:  Diagnosis Date  . Attention deficit disorder   . Combined hyperlipidemia   . Goiter   . Gynecomastia, male   . Hypertension   . Noncompliance with treatment   . Obesity, Class III, BMI 40-49.9 (morbid obesity) (HCC)   . 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 Thyroiditis, autoimmune   . Type 2 diabetes mellitus in patient age 84-19 years with HbA1C goal below 7.5     Patient Active Problem List   Diagnosis Date Noted  . Circadian rhythm sleep disorder, shift work type 03/09/2018  . Type 2 diabetes mellitus with hyperglycemia, without long-term current use of insulin (HCC) 02/07/2017  . Acanthosis nigricans, acquired 11/24/2014  . Medication management 07/09/2013  . Schizoaffective disorder (HCC)   . Gynecomastia, male   . Combined hyperlipidemia   . Attention deficit hyperactivity disorder  (ADHD), combined type, moderate   . Obesity 09/28/2010    Past Surgical History:  Procedure Laterality Date  . CIRCUMCISION         Home Medications    Prior to Admission medications   Medication Sig Start Date End Date Taking? Authorizing Provider  omega-3 acid ethyl esters (LOVAZA) 1 g capsule Take by mouth 2 (two) times daily.   Yes [provider]  Brexpiprazole (REXULTI) 4 MG TABS Take 4 mg by mouth daily. 09/25/18   Chauncey Mann, MD  Continuous Blood Gluc Receiver (FREESTYLE LIBRE READER) DEVI 1 Device by Does not apply route 3 (three) times daily. 09/16/17   Reather Littler, MD  Continuous Blood Gluc Sensor (FREESTYLE LIBRE SENSOR SYSTEM) MISC 1 Device by Does not apply route every 30 (thirty) days. 11/17/18   Elson Areas, PA-C  dapagliflozin propanediol (FARXIGA) 10 MG TABS tablet Take 10 mg by mouth daily. 11/17/18   Elson Areas, PA-C  Dexmethylphenidate HCl 30 MG CP24 Take 1 capsule (30 mg total) by mouth 2 (two) times daily for 30 days. 09/25/18 10/25/18  Chauncey Mann, MD  Dexmethylphenidate HCl 30 MG CP24 Take 1 capsule (30 mg total) by mouth 2 (two) times daily for 30 days. 10/25/18 11/24/18  Chauncey Mann, MD  Dexmethylphenidate HCl 30 MG CP24 Take 1 capsule (30 mg total) by mouth 2 (two) times daily for 30 days. 11/24/18 12/24/18  Chauncey Mann, MD  glucose blood (ONETOUCH VERIO) test strip USE TO CHECK BLOOD SUGAR  [place] 03/11/17   Philemon Kingdom, MD  metFORMIN (GLUCOPHAGE) 1000 MG tablet Take 1 tablet (1,000 mg total) by mouth 2 (two) times daily with a meal. 11/17/18   Threasa Alpha, Hollace Kinnier, PA-C  [facility] DELICA LANCETS 62Z MISC Use to check sugar 6 times daily 03/11/17   Philemon Kingdom, MD  sitaGLIPtin (JANUVIA) 100 MG tablet Take 1 tablet (100 mg total) by mouth daily. 04/12/17   Philemon Kingdom, MD    Family History Family History  Problem Relation Age of Onset  . Obesity Mother   . Diabetes Maternal Aunt   . Obesity Maternal Aunt    . Diabetes Maternal Grandmother   . Obesity Maternal Grandmother   . Cancer Neg Hx     Social History Social History   Tobacco Use  . Smoking status: Never Smoker  . Smokeless tobacco: Never Used  Substance Use Topics  . Alcohol use: Yes    Comment: occasionally  . Drug use: No     Allergies   Citrus   Review of Systems Review of Systems  Constitutional: Negative for activity change, appetite change, fatigue, fever and unexpected weight change.  Respiratory: Negative for cough and shortness of breath.   Cardiovascular: Negative for chest pain and palpitations.  Gastrointestinal: Positive for nausea and vomiting. Negative for abdominal pain and diarrhea.  Endocrine: Negative for polydipsia, polyphagia and polyuria.  Musculoskeletal: Negative for arthralgias and myalgias.  Skin: Negative for rash and wound.  Neurological: Negative for speech difficulty and headaches.  Psychiatric/Behavioral: Negative for self-injury and suicidal ideas.  All other systems reviewed and are negative.    Physical Exam Triage Vital Signs ED Triage Vitals  Enc Vitals Group     BP 02/05/19 1333 112/77     Pulse Rate 02/05/19 1333 96     Resp 02/05/19 1333 16     Temp 02/05/19 1333 98 F (36.7 C)     Temp Source 02/05/19 1333 Oral     SpO2 02/05/19 1333 97 %     Weight --      Height --      Head Circumference --      Peak Flow --      Pain Score 02/05/19 1340 0     Pain Loc --      Pain Edu? --      Excl. in [place]? --    No data found.  Updated Vital Signs BP 112/77 (BP Location: Left Arm)   Pulse 96   Temp 98 F (36.7 C) (Oral)   Resp 16   SpO2 97%    Physical Exam Constitutional:      General: He is not in acute distress.    Appearance: He is obese. He is not ill-appearing.  HENT:     Head: Normocephalic and atraumatic.     Nose: Nose normal.     Mouth/Throat:     Mouth: Mucous membranes are moist.     Pharynx: Oropharynx is clear. No oropharyngeal exudate or  posterior oropharyngeal erythema.  Eyes:     General: No scleral icterus.    Extraocular Movements: Extraocular movements intact.     Conjunctiva/sclera: Conjunctivae normal.     Pupils: Pupils are equal, round, and reactive to light.  Neck:     Musculoskeletal: Normal range of motion and neck supple. No muscular tenderness.  Cardiovascular:     Rate and Rhythm: Normal rate and regular rhythm.     Heart sounds: No murmur. No gallop.  Pulmonary:     Effort: Pulmonary effort is normal. No respiratory distress.     Breath sounds: No wheezing.  Abdominal:     General: Bowel sounds are normal.     Tenderness: There is no abdominal tenderness.  Musculoskeletal: Normal range of motion.     Right lower leg: No edema.     Left lower leg: No edema.  Lymphadenopathy:     Cervical: No cervical adenopathy.  Skin:    General: Skin is warm.     Capillary Refill: Capillary refill takes less than 2 seconds.     Coloration: Skin is not jaundiced or pale.     Findings: No bruising or rash.  Neurological:     General: No focal deficit present.     Mental Status: He is alert and oriented to person, place, and time.  Psychiatric:        Mood and Affect: Mood normal.        Behavior: Behavior normal.        Thought Content: Thought content normal.        Judgment: Judgment normal.      UC Treatments / Results  Labs (all labs ordered are listed, but only abnormal results are displayed) Labs Reviewed  NOVEL CORONAVIRUS, NAA    EKG   Radiology No results found.  Procedures Procedures (including critical care time)  Medications Ordered in UC Medications - No data to display  Initial Impression / Assessment and Plan / UC Course  I have reviewed the triage vital signs and the nursing notes.  Pertinent labs & imaging results that were available during my care of the patient were reviewed by me and considered in my medical decision making (see chart for details).     1.  Nausea,  occasional vomiting Patient has freestyle glucose monitor on left arm: App reviewed by me at time of appointment.  Average glucose 104.  Patient had hypoglycemic event this morning, though was asymptomatic.  Reviewed that financial stressors and job dissatisfaction could be contributory to symptoms as his diabetes is well controlled, he is compliant with medications, and is without any other symptoms during these episodes which are limited to patient being at work.  Patient verbalized understanding, agrees that this is likely the case.  Denies SI/HI, feels he has support at home: Will discuss with PCP if needed.  Return precautions discussed, patient verbalized understanding and is agreeable to plan. Final Clinical Impressions(s) / UC Diagnoses   Final diagnoses:  Non-intractable vomiting with nausea, unspecified vomiting type     Discharge Instructions     Return for worsening nausea, vomiting, chest pain, shortness breath, abdominal pain, fever. Very important to call and make an appointment with your primary care for routine care.    ED Prescriptions    None     PDMP not reviewed this encounter.   Hall-Potvin, [place], [place]-C 02/05/19 1446

## 2019-02-05 NOTE — Discharge Instructions (Signed)
Return for worsening nausea, vomiting, chest pain, shortness breath, abdominal pain, fever. Very important to call and make an appointment with your primary care for routine care.

## 2019-02-05 NOTE — ED Triage Notes (Signed)
Pt presents to UC stating he's had nausea and vomiting only at work for the past 1.5 months. 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

## 2019-02-23 ENCOUNTER — Ambulatory Visit
Admission: EM | Admit: 2019-02-23 | Discharge: 2019-02-23 | Disposition: A | Discharge status: Home / Self Care | Payer: BC Managed Care – PPO | Attending: Emergency Medicine | Admitting: Emergency Medicine

## 2019-02-23 ENCOUNTER — Other Ambulatory Visit: Payer: Self-pay

## 2019-02-23 DIAGNOSIS — R05 Cough: Secondary | ICD-10-CM

## 2019-02-23 DIAGNOSIS — J302 Other seasonal allergic rhinitis: Secondary | ICD-10-CM | POA: Diagnosis not present

## 2019-02-23 DIAGNOSIS — R059 Cough, unspecified: Secondary | ICD-10-CM

## 2019-02-23 NOTE — ED Provider Notes (Signed)
[facility]    CSN: [id number] Arrival date & time: 02/23/19  1832      History   Chief Complaint Chief Complaint  Patient presents with  . Sinus Problem    HPI Jared Potts is a 23 y.o. male presenting for nasal congestion, sinus pressure, sore throat since early this morning.  Patient has tried salt water gargles, Benadryl with mildly for symptoms.  Patient also endorsing mild, nonproductive cough that is "scratchy "and worse with deep, full inspiration.  Patient works at [employer]: States that people have been sick, he is not sure if anyone's tested positive for Covid.   Past Medical History:  Diagnosis Date  . Attention deficit disorder   . Combined hyperlipidemia   . Goiter   . Gynecomastia, male   . Hypertension   . Noncompliance with treatment   . Obesity, Class III, BMI 40-49.9 (morbid obesity) (HCC)   . 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 Thyroiditis, autoimmune   . Type 2 diabetes mellitus in patient age 68-19 years with HbA1C goal below 7.5     Patient Active Problem List   Diagnosis Date Noted  . Circadian rhythm sleep disorder, shift work type 03/09/2018  . Type 2 diabetes mellitus with hyperglycemia, without long-term current use of insulin (HCC) 02/07/2017  . Acanthosis nigricans, acquired 11/24/2014  . Medication management 07/09/2013  . Schizoaffective disorder (HCC)   . Gynecomastia, male   . Combined hyperlipidemia   . Attention deficit hyperactivity disorder (ADHD), combined type, moderate   . Obesity 09/28/2010    Past Surgical History:  Procedure Laterality Date  . CIRCUMCISION         Home Medications    Prior to Admission medications   Medication Sig Start Date End Date Taking? Authorizing Provider  Brexpiprazole (REXULTI) 4 MG TABS Take 4 mg by mouth daily. 09/25/18   Chauncey Mann, MD  Continuous Blood Gluc Receiver (FREESTYLE LIBRE READER) DEVI 1 Device by Does not apply route 3 (three) times daily. 09/16/17   Reather Littler,  MD  Continuous Blood Gluc Sensor (FREESTYLE LIBRE SENSOR SYSTEM) MISC 1 Device by Does not apply route every 30 (thirty) days. 11/17/18   Elson Areas, PA-C  dapagliflozin propanediol (FARXIGA) 10 MG TABS tablet Take 10 mg by mouth daily. 11/17/18   Elson Areas, PA-C  Dexmethylphenidate HCl 30 MG CP24 Take 1 capsule (30 mg total) by mouth 2 (two) times daily for 30 days. 09/25/18 10/25/18  Chauncey Mann, MD  Dexmethylphenidate HCl 30 MG CP24 Take 1 capsule (30 mg total) by mouth 2 (two) times daily for 30 days. 10/25/18 11/24/18  Chauncey Mann, MD  Dexmethylphenidate HCl 30 MG CP24 Take 1 capsule (30 mg total) by mouth 2 (two) times daily for 30 days. 11/24/18 12/24/18  Chauncey Mann, MD  glucose blood (ONETOUCH VERIO) test strip USE TO CHECK BLOOD SUGAR 6 TIMES DAILY 03/11/17   Carlus Pavlov, MD  metFORMIN (GLUCOPHAGE) 1000 MG tablet Take 1 tablet (1,000 mg total) by mouth 2 (two) times daily with a meal. 11/17/18   Elson Areas, PA-C  omega-3 acid ethyl esters (LOVAZA) 1 g capsule Take by mouth 2 (two) times daily.    [provider]  Dola Argyle LANCETS 33G MISC Use to check sugar 6 times daily 03/11/17   Carlus Pavlov, MD  sitaGLIPtin (JANUVIA) 100 MG tablet Take 1 tablet (100 mg total) by mouth daily. 04/12/17   Carlus Pavlov, MD    Family  History Family History  Problem Relation Age of Onset  . Obesity Mother   . Diabetes Maternal Aunt   . Obesity Maternal Aunt   . Diabetes Maternal Grandmother   . Obesity Maternal Grandmother   . Hypertension Father   . Cancer Neg Hx     Social History Social History   Tobacco Use  . Smoking status: Never Smoker  . Smokeless tobacco: Never Used  Substance Use Topics  . Alcohol use: Yes    Comment: occasionally  . Drug use: No     Allergies   Citrus   Review of Systems Review of Systems  Constitutional: Negative for activity change, appetite change, fatigue and fever.  HENT: Positive for  congestion, postnasal drip and sore throat. Negative for dental problem, drooling, ear discharge, ear pain, facial swelling, hearing loss, rhinorrhea, sinus pain, sneezing, tinnitus, trouble swallowing and voice change.   Eyes: Negative for pain and redness.  Respiratory: Positive for cough. Negative for shortness of breath, wheezing and stridor.   Cardiovascular: Negative for chest pain and palpitations.  Gastrointestinal: Negative for abdominal pain, diarrhea, nausea and vomiting.  Musculoskeletal: Negative for arthralgias and myalgias.     Physical Exam Triage Vital Signs ED Triage Vitals  Enc Vitals Group     BP      Pulse      Resp      Temp      Temp src      SpO2      Weight      Height      Head Circumference      Peak Flow      Pain Score      Pain Loc      Pain Edu?      Excl. in [place]?    No data found.  Updated Vital Signs BP 106/79 (BP Location: Left Arm)   Pulse 91   Temp 98.4 F (36.9 C) (Oral)   Wt 267 lb 6.4 oz (121.3 kg)   SpO2 97%   BMI 35.28 kg/m   Visual Acuity Right Eye Distance:   Left Eye Distance:   Bilateral Distance:    Right Eye Near:   Left Eye Near:    Bilateral Near:     Physical Exam Constitutional:      General: He is not in acute distress. HENT:     Head: Normocephalic and atraumatic.     Right Ear: Tympanic membrane, ear canal and external ear normal.     Left Ear: Tympanic membrane, ear canal and external ear normal.     Nose: No nasal deformity, congestion or rhinorrhea.     Comments: Bilateral turbinate edema with mucosal pallor.  No sinus tenderness bilaterally    Mouth/Throat:     Mouth: Mucous membranes are moist.     Tongue: Tongue does not deviate from midline.     Pharynx: Oropharynx is clear. Uvula midline.     Comments: No tonsillar hypertrophy or exudate Eyes:     General: No scleral icterus.    Conjunctiva/sclera: Conjunctivae normal.     Pupils: Pupils are equal, round, and reactive to light.  Neck:      Musculoskeletal: Normal range of motion and neck supple. No muscular tenderness.  Cardiovascular:     Rate and Rhythm: Normal rate.  Pulmonary:     Effort: Pulmonary effort is normal. No respiratory distress.     Breath sounds: No wheezing.     Comments: Good air entry bilaterally Lymphadenopathy:  Cervical: No cervical adenopathy.  Neurological:     Mental Status: He is alert.      UC Treatments / Results  Labs (all labs ordered are listed, but only abnormal results are displayed) Labs Reviewed  NOVEL CORONAVIRUS, NAA    EKG   Radiology No results found.  Procedures Procedures (including critical care time)  Medications Ordered in UC Medications - No data to display  Initial Impression / Assessment and Plan / UC Course  I have reviewed the triage vital signs and the nursing notes.  Pertinent labs & imaging results that were available during my care of the patient were reviewed by me and considered in my medical decision making (see chart for details).     Patient afebrile/nontoxic.  Symptoms likely more related to allergies, though this is difficult to truly discern given such short duration.  Reviewed this with patient as well as expected course of recovery.  Covid test pending: Patient quarantine until results are back.  We will treat supportively as outlined below.  Return precautions discussed, patient verbalized understanding and is agreeable to plan. Final Clinical Impressions(s) / UC Diagnoses   Final diagnoses:  Cough  Seasonal allergic rhinitis, unspecified trigger     Discharge Instructions     Use allergy medication and nasacort every day. Drink plenty of water & important to get sleep.  Your COVID test is pending: Is important you quarantine at home until your results are back. You may take OTC Tylenol for fever and myalgias.  It is important to drink plenty of water throughout the day to stay hydrated. If you test positive and later develop  severe fever, cough, or shortness of breath, it is recommended that you go to the ER for further evaluation.    ED Prescriptions    None     PDMP not reviewed this encounter.   Neldon Mc [place], [place] 02/23/19 1921

## 2019-02-23 NOTE — Discharge Instructions (Signed)
Use allergy medication and nasacort every day. Drink plenty of water & important to get sleep.  Your COVID test is pending: Is important you quarantine at home until your results are back. You may take OTC Tylenol for fever and myalgias.  It is important to drink plenty of water throughout the day to stay hydrated. If you test positive and later develop severe fever, cough, or shortness of breath, it is recommended that you go to the ER for further evaluation.

## 2019-02-23 NOTE — ED Triage Notes (Addendum)
Pt. States he has a  sinus infection and a lot of drainage. The roof of his mouth is achy with nasal congestion, runny nose, mild cough, sneezing are his other symptoms. Wants to be COVID tested today he was last tested early Sept.

## 2019-02-25 LAB — NOVEL CORONAVIRUS, NAA: SARS-CoV-2, NAA: NOT DETECTED

## 2019-04-21 ENCOUNTER — Telehealth: Payer: Self-pay | Admitting: Psychiatry

## 2019-04-21 ENCOUNTER — Other Ambulatory Visit: Payer: Self-pay

## 2019-04-21 DIAGNOSIS — F902 Attention-deficit hyperactivity disorder, combined type: Secondary | ICD-10-CM

## 2019-04-21 MED ORDER — DEXMETHYLPHENIDATE HCL ER 30 MG PO CP24: 30.0000 mg | ORAL_CAPSULE | Freq: Two times a day (BID) | ORAL | 0 refills | Status: DC

## 2019-04-21 NOTE — Telephone Encounter (Signed)
Pt requesting a refill for his Focalin 30mg  XR. Appt scheduled for 12/17, last telehealth visit 5/20. Fill at the [facility] on [address].

## 2019-04-21 NOTE — Telephone Encounter (Signed)
Last refill 01/26/2019 Has apt 04/23/2019 Pended for approval

## 2019-04-21 NOTE — Telephone Encounter (Signed)
Last appointment in late May so that this is last fill possible for Focalin without office appointment reminder given 30 mg XR twice daily #60 sent to [facility] medically necessary no contraindication

## 2019-04-23 ENCOUNTER — Ambulatory Visit: Payer: BC Managed Care – PPO | Admitting: Psychiatry

## 2019-04-23 ENCOUNTER — Other Ambulatory Visit: Payer: Self-pay | Admitting: Psychiatry

## 2019-04-23 DIAGNOSIS — F250 Schizoaffective disorder, bipolar type: Secondary | ICD-10-CM

## 2019-04-23 NOTE — Telephone Encounter (Signed)
Still compliant with Rexulti 4 mg daily for schizoaffective and last filling Focalin mid December no-show for office appointment today we will send the 90-day supply of Rexulti to [facility]

## 2019-04-23 NOTE — Telephone Encounter (Signed)
They no showed yesterday

## 2019-06-11 ENCOUNTER — Other Ambulatory Visit: Payer: Self-pay

## 2019-06-11 ENCOUNTER — Ambulatory Visit
Admission: EM | Admit: 2019-06-11 | Discharge: 2019-06-11 | Disposition: A | Discharge status: Home / Self Care | Payer: BC Managed Care – PPO | Attending: Emergency Medicine | Admitting: Emergency Medicine

## 2019-06-11 ENCOUNTER — Encounter: Payer: Self-pay | Admitting: Emergency Medicine

## 2019-06-11 DIAGNOSIS — G5602 Carpal tunnel syndrome, left upper limb: Secondary | ICD-10-CM | POA: Diagnosis not present

## 2019-06-11 MED ORDER — NAPROXEN 500 MG PO TABS: 500.0000 mg | ORAL_TABLET | Freq: Two times a day (BID) | ORAL | 0 refills | Status: DC

## 2019-06-11 NOTE — ED Triage Notes (Addendum)
Pt presents to [facility] for assessment of left hand numbness when waking up every morning for 2-3 weeks.  Goes away within an hour of waking up.  Patient states it happens to the right hand sometimes, when he sleeps on the other side.  Patient states today he felt a pop in hand and it felt like it was going to go numb again.

## 2019-06-11 NOTE — Discharge Instructions (Addendum)
Important call PCP to ask for any no refills of medications, glucometers, testing supplies.  Recommend RICE: rest, ice, compression, elevation as needed for pain.   Cold therapy (ice packs) can be used to help swelling both after injury and after prolonged use of areas of chronic pain/aches.  For pain: naproxen

## 2019-06-11 NOTE — ED Notes (Signed)
Patient able to ambulate independently  

## 2019-06-11 NOTE — ED Provider Notes (Signed)
[facility]    CSN: [id number] Arrival date & time: 06/11/19  1832      History   Chief Complaint Chief Complaint  Patient presents with  . hand numbness    HPI Jared Potts is a 24 y.o. male is a right-handed male with history of type 2 diabetes, obesity presenting for 3 to 4-week course of left hand numbness.  States initially it was occurring upon awakening, patient felt this to be positional, though since this morning it lasted longer than normal.  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  Denies dropping objects, weakness, discoloration, shoulder or neck pain.  No recent trauma, headache, chest pain, difficulty breathing.  Has not tried anything for symptoms as he thought "it would just go away ".   Past Medical History:  Diagnosis Date  . Attention deficit disorder   . Combined hyperlipidemia   . Goiter   . Gynecomastia, male   . Hypertension   . Noncompliance with treatment   . Obesity, Class III, BMI 40-49.9 (morbid obesity) (HCC)   . 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 Thyroiditis, autoimmune   . Type 2 diabetes mellitus in patient age 23-19 years with HbA1C goal below 7.5     Patient Active Problem List   Diagnosis Date Noted  . Circadian rhythm sleep disorder, shift work type 03/09/2018  . Type 2 diabetes mellitus with hyperglycemia, without long-term current use of insulin (HCC) 02/07/2017  . Acanthosis nigricans, acquired 11/24/2014  . Medication management 07/09/2013  . Schizoaffective disorder (HCC)   . Gynecomastia, male   . Combined hyperlipidemia   . Attention deficit hyperactivity disorder (ADHD), combined type, moderate   . Obesity 09/28/2010    Past Surgical History:  Procedure Laterality Date  . CIRCUMCISION         Home Medications    Prior to Admission medications   Medication Sig Start Date End Date Taking? Authorizing Provider  Continuous Blood  Gluc Receiver (FREESTYLE LIBRE READER) DEVI 1 Device by Does not apply route 3 (three) times daily. 09/16/17   Reather Littler, MD  Continuous Blood Gluc Sensor (FREESTYLE LIBRE SENSOR SYSTEM) MISC 1 Device by Does not apply route every 30 (thirty) days. 11/17/18   Elson Areas, PA-C  dapagliflozin propanediol (FARXIGA) 10 MG TABS tablet Take 10 mg by mouth daily. 11/17/18   Elson Areas, PA-C  Dexmethylphenidate HCl 30 MG CP24 Take 1 capsule (30 mg total) by mouth 2 (two) times daily for 30 days. 10/25/18 11/24/18  Chauncey Mann, MD  Dexmethylphenidate HCl 30 MG CP24 Take 1 capsule (30 mg total) by mouth 2 (two) times daily for 30 days. 11/24/18 12/24/18  Chauncey Mann, MD  Dexmethylphenidate HCl 30 MG CP24 Take 1 capsule (30 mg total) by mouth 2 (two) times daily. 04/21/19 05/21/19  Chauncey Mann, MD  glucose blood (ONETOUCH VERIO) test strip USE TO CHECK BLOOD SUGAR 6 TIMES DAILY 03/11/17   Carlus Pavlov, MD  metFORMIN (GLUCOPHAGE) 1000 MG tablet Take 1 tablet (1,000 mg total) by mouth 2 (two) times daily with a meal. 11/17/18   Elson Areas, PA-C  naproxen (NAPROSYN) 500 MG tablet Take 1 tablet (500 mg total) by mouth 2 (two) times daily. 06/11/19   Hall-Potvin, [place], PA-C  omega-3 acid ethyl esters (LOVAZA) 1 g capsule Take by mouth 2 (two) times daily.    [provider]  Dola Argyle LANCETS  33G MISC Use to check sugar 6 times daily 03/11/17   Carlus Pavlov, MD  REXULTI 4 MG TABS TAKE 1 TABLET BY MOUTH EVERY DAY 04/23/19   Chauncey Mann, MD  sitaGLIPtin (JANUVIA) 100 MG tablet Take 1 tablet (100 mg total) by mouth daily. 04/12/17   Carlus Pavlov, MD    Family History Family History  Problem Relation Age of Onset  . Obesity Mother   . Diabetes Maternal Aunt   . Obesity Maternal Aunt   . Diabetes Maternal Grandmother   . Obesity Maternal Grandmother   . Hypertension Father   . Cancer Neg Hx     Social History Social History   Tobacco Use  .  Smoking status: Never Smoker  . Smokeless tobacco: Never Used  Substance Use Topics  . Alcohol use: Yes    Comment: occasionally  . Drug use: No     Allergies   Citrus   Review of Systems As per HPI   Physical Exam Triage Vital Signs ED Triage Vitals  Enc Vitals Group     BP 06/11/19 1840 124/81     Pulse Rate 06/11/19 1840 72     Resp 06/11/19 1840 16     Temp 06/11/19 1840 98 F (36.7 C)     Temp Source 06/11/19 1840 Oral     SpO2 06/11/19 1840 97 %     Weight --      Height --      Head Circumference --      Peak Flow --      Pain Score 06/11/19 1841 0     Pain Loc --      Pain Edu? --      Excl. in GC? --    No data found.  Updated Vital Signs BP 124/81 (BP Location: Left Arm)   Pulse 72   Temp 98 F (36.7 C) (Oral)   Resp 16   SpO2 97%   Visual Acuity Right Eye Distance:   Left Eye Distance:   Bilateral Distance:    Right Eye Near:   Left Eye Near:    Bilateral Near:     Physical Exam Constitutional:      General: He is not in acute distress. HENT:     Head: Normocephalic and atraumatic.  Eyes:     General: No scleral icterus.    Pupils: Pupils are equal, round, and reactive to light.  Cardiovascular:     Rate and Rhythm: Normal rate.  Pulmonary:     Effort: Pulmonary effort is normal. No respiratory distress.     Breath sounds: No wheezing.  Musculoskeletal:     Right wrist: Normal.     Left wrist: No swelling, deformity, tenderness, bony tenderness, snuff box tenderness or crepitus. Normal range of motion. Normal pulse.     Right hand: Normal.     Left hand: Normal. No tenderness or bony tenderness. Normal range of motion. Normal strength. Normal sensation. Normal capillary refill.     Comments: Positive Phalen's, negative Tinel's, negative Finkelstein's  Skin:    Coloration: Skin is not jaundiced or pale.  Neurological:     Mental Status: He is alert and oriented to person, place, and time.      UC Treatments / Results   Labs (all labs ordered are listed, but only abnormal results are displayed) Labs Reviewed - No data to display  EKG   Radiology No results found.  Procedures Procedures (including critical care time)  Medications Ordered  in UC Medications - No data to display  Initial Impression / Assessment and Plan / UC Course  I have reviewed the triage vital signs and the nursing notes.  Pertinent labs & imaging results that were available during my care of the patient were reviewed by me and considered in my medical decision making (see chart for details).     H&P consistent with carpal tunnel syndrome of left hand.  Low concern for compartment syndrome, neurovascular compromise at this time.  Will initiate RICE, naproxen, provide wrist brace in office which patient tolerated well for immobilization and support when working.  Work note provided.  Return precautions discussed, patient verbalized understanding and is agreeable to plan. Final Clinical Impressions(s) / UC Diagnoses   Final diagnoses:  Carpal tunnel syndrome, left     Discharge Instructions     Important call PCP to ask for any no refills of medications, glucometers, testing supplies.  Recommend RICE: rest, ice, compression, elevation as needed for pain.   Cold therapy (ice packs) can be used to help swelling both after injury and after prolonged use of areas of chronic pain/aches.  For pain: naproxen    ED Prescriptions    Medication Sig Dispense Auth. Provider   naproxen (NAPROSYN) 500 MG tablet Take 1 tablet (500 mg total) by mouth 2 (two) times daily. 30 tablet Hall-Potvin, [place], PA-C     PDMP not reviewed this encounter.   Hall-Potvin, [place], [place] 06/11/19 1918

## 2019-07-09 ENCOUNTER — Encounter: Payer: Self-pay | Admitting: Psychiatry

## 2019-07-09 ENCOUNTER — Ambulatory Visit (INDEPENDENT_AMBULATORY_CARE_PROVIDER_SITE_OTHER): Payer: BC Managed Care – PPO | Admitting: Psychiatry

## 2019-07-09 DIAGNOSIS — G4726 Circadian rhythm sleep disorder, shift work type: Secondary | ICD-10-CM | POA: Diagnosis not present

## 2019-07-09 DIAGNOSIS — F902 Attention-deficit hyperactivity disorder, combined type: Secondary | ICD-10-CM

## 2019-07-09 DIAGNOSIS — F250 Schizoaffective disorder, bipolar type: Secondary | ICD-10-CM

## 2019-07-09 MED ORDER — REXULTI 4 MG PO TABS: 1.0000 | ORAL_TABLET | Freq: Every day | ORAL | 1 refills | Status: DC

## 2019-07-09 MED ORDER — DEXMETHYLPHENIDATE HCL ER 30 MG PO CP24: 30.0000 mg | ORAL_CAPSULE | Freq: Two times a day (BID) | ORAL | 0 refills | Status: DC

## 2019-07-09 NOTE — Progress Notes (Signed)
[facility]  Patient ID: Jared Potts,  MRN: [id number]  PCP: Mirna Mires, MD  Date of Evaluation: 07/09/2019 Time spent:20 minutes from 1120 to 1140  Chief Complaint:  Chief Complaint    Depression; Hallucinations; Manic Behavior; ADHD; Anxiety; Stress      HISTORY/CURRENT STATUS: [name] is provided telemedicine audiovisual appointment session, declining the videocamera offered due to his social anxiety, phone to phone 20 minutes with consent with epic collateral for psychiatric interview and exam in 32-month evaluation and management of schizoaffective bipolar, ADHD, and social anxiety being 6 months overdue for follow-up.  He now has 15 months on full dose Rexulti 4 mg every morning having been on telemedicine last appointment so that we have no office weight since the appointment 15 months ago when mother attended clarifying at that time or concern for his social anxiety more consequential such as attending church, participating in activities, and work.  He has 4 dispenses of monthly Focalin 30 mg XR twice daily in the interim the last being 04/21/2019 therefore taking full dose slightly less than 50% of the days doubtfully responsible for less social anxiety.  He is looking for a second job now at [employer] home improvement still working [employer] disclosing in one of his 3 urgent care appointments in the interim for medical symptoms that he was stressed by [employer] and other expenses.  He does not describe spending as much money extravagantly on his girlfriend as they have had their 4-year anniversary on Valentine's.  He describes going to the gym to work out though limited by many of the sites being shutdown.  He denies delusions or anger outbursts in the interim.  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  He is not psychotic, manic, delirious, suicidal, or intoxicated today.  Depression   The patient presents with schizoaffective bipolar as a chronic and recurrentproblem.The most recent depressive episode started 15 months ago. Recurrence quality is sudden. The mood is blunt but manic or depressive problem occurs intermittently.The problem has been gradually improvingsince onset.Associated symptoms include decreased concentration,fatigue,and boredom. Associated symptoms include no hopelessness,no helplessness, no insomnia, no irritability,no decreased interest,no body aches,no headaches,no indigestion,no sadnessand no suicidal ideas.The symptoms are aggravated by work stress, family issues, medication and social issues.Past treatments include other medications and psychotherapy.Past compliance problems include difficulty with treatment plan, medication issues and difficulty understanding directions.Risk factors include a change in medication usage/dosage, family history of mental illness and family violence. Past medical history includes anxiety,bipolar disorder,eating disorderand mental health disorder. Pertinent negatives include no physical disability,no recent psychiatric admission,no depression,no obsessive-compulsive disorder,no post-traumatic stress disorder,no schizophrenia,no suicide attemptsand no head trauma.  Individual Medical History/ Review of Systems: Changes? :Yes Last weight here was 284 pounds 15 months ago from his lowest weight under my care since 2016 of 227 pounds with weight in urgent care 02/23/2019 being 267 pounds.  Urgent care continues to advise primary care patient stating he is about to start new primary care last seeing [facility] in 2019 with hemoglobin A1c not in epic patient not acknowledging interim labs  seeing pediatric Endo in 2017.  Most recently had carpal tunnel symptoms left wrist seen in the ED treated with Naprosyn and  RICE.  Allergies: Citrus  Current Medications:  Current Outpatient Medications:  .  Brexpiprazole (REXULTI) 4 MG TABS, Take 1 tablet by mouth daily after breakfast., Disp: 90 tablet, Rfl: 1 .  Continuous Blood Gluc Receiver (FREESTYLE LIBRE READER) DEVI, 1 Device by Does not apply route 3 (three) times daily., Disp: 1 Device, Rfl: 1 .  Continuous Blood Gluc Sensor (FREESTYLE LIBRE SENSOR SYSTEM) MISC, 1 Device by Does not apply route every 30 (thirty) days., Disp: 3 each, Rfl: 11 .  dapagliflozin propanediol (FARXIGA) 10 MG TABS tablet, Take 10 mg by mouth daily., Disp: 90 tablet, Rfl: 3 .  Dexmethylphenidate HCl 30 MG CP24, Take 1 capsule (30 mg total) by mouth 2 (two) times daily., Disp: 60 capsule, Rfl: 0 .  [START ON 08/08/2019] Dexmethylphenidate HCl 30 MG CP24, Take 1 capsule (30 mg total) by mouth 2 (two) times daily., Disp: 60 capsule, Rfl: 0 .  [START ON 09/07/2019] Dexmethylphenidate HCl 30 MG CP24, Take 1 capsule (30 mg total) by mouth 2 (two) times daily., Disp: 60 capsule, Rfl: 0 .  glucose blood (ONETOUCH VERIO) test strip, USE TO CHECK BLOOD SUGAR 6 TIMES DAILY, Disp: 200 each, Rfl: 6 .  metFORMIN (GLUCOPHAGE) 1000 MG tablet, Take 1 tablet (1,000 mg total) by mouth 2 (two) times daily with a meal., Disp: 60 tablet, Rfl: 6 .  naproxen (NAPROSYN) 500 MG tablet, Take 1 tablet (500 mg total) by mouth 2 (two) times daily., Disp: 30 tablet, Rfl: 0 .  omega-3 acid ethyl esters (LOVAZA) 1 g capsule, Take by mouth 2 (two) times daily., Disp: , Rfl:  .  ONETOUCH DELICA LANCETS 33G MISC, Use to check sugar 6 times daily, Disp: 600 each, Rfl: 5 .  sitaGLIPtin (JANUVIA) 100 MG tablet, Take 1 tablet (100 mg total) by mouth daily., Disp: 30 tablet, Rfl: 5  Medication Side Effects: none  Family Medical/ Social History: Changes? No  MENTAL HEALTH EXAM:  There were no vitals  taken for this visit.There is no height or weight on file to calculate BMI. as not present in office today.  General Appearance: N/A  Eye Contact:  N/A  Speech:  Clear and Coherent, Normal Rate and Slow  Volume:  Normal  Mood:  Euthymic to mild dysphoric  Affect:  Blunt, Inappropriate and Anxious  Thought Process:  Coherent, Goal Directed, Irrelevant, Linear and Descriptions of Associations: Tangential  Orientation:  Full (Time, Place, and Person)  Thought Content: Ilusions, Rumination and Tangential   Suicidal Thoughts:  No  Homicidal Thoughts:  No  Memory:  Immediate;   Good and Fair Remote;   Good and Fair  Judgement:  Fair  Insight:  Fair to limited  Psychomotor Activity:  N/A  Concentration:  Concentration: Good and Attention Span: Fair  Recall:  [employer] of Knowledge: Good  Language: Fair  Assets:  Leisure Time Social Support Talents/Skills  ADL's:  Intact  Cognition: WNL  Prognosis:  Fair    DIAGNOSES:    ICD-10-CM   1. Schizoaffective disorder, bipolar type (HCC)  F25.0 Brexpiprazole (REXULTI) 4 MG TABS  2. Attention deficit hyperactivity disorder (ADHD), combined type, moderate  F90.2 Dexmethylphenidate HCl 30 MG CP24    Dexmethylphenidate HCl 30 MG CP24    Dexmethylphenidate HCl 30 MG CP24  3. Circadian rhythm sleep disorder, shift work type  G47.26     Receiving Psychotherapy: No    RECOMMENDATIONS: Psychosupportive psychoeducation addresses interactively and interpersonally his need for compliance with primary care especially for diabetes treatment  and ongoing mental health care especially for schizoaffective.  He does seem improved relative to ADHD and social anxiety taking his Focalin likely half the time and the full dose Rexulti.  He seems to be compliant historically with his Rexulti denying adverse effects but having no labs now possibly since internal medicine in 2019 at [place] not having those data in Epic.  Therefore the session summarizes the  immediate need to add primary care and laboratory monitoring to his gym workouts and follow-up of last nutrition and diabetes appointment of 2019.  Patient is E scribed Rexulti 4 mg every morning sent as #90 with 2 refills to [facility] [address] for schizoaffective and social anxiety.  He is E scribed Focalin 30 mg XR twice daily morning and midday sent as #60 each for March 4, April 3, and May 3 to [facility] for ADHD.  He returns for follow-up in 6 months or sooner if needed or willing.  Virtual Visit via Video Note  I connected with Regino Schultze on 07/09/19 at 11:40 AM EST by a video enabled telemedicine application and verified that I am speaking with the correct person using two identifiers.  Location: Patient: Individually by audio only at mother's residence refusing videocamera due to schizoaffective disorder Provider: [facility] office   I discussed the limitations of evaluation and management by telemedicine and the availability of in person appointments. The patient expressed understanding and agreed to proceed.  History of Present Illness: 7-month evaluation and management of schizoaffective bipolar, ADHD, and social anxiety being 6 months overdue for follow-up.  He now has 15 months on full dose Rexulti 4 mg every morning having been on telemedicine last appointment so that we have no office weight since the appointment 15 months ago when mother attended clarifying at that time or concern for his social anxiety more consequential such as attending church, participating in activities, and work.   Observations/Objective: Mood:  Euthymic to mild dysphoric  Affect:  Blunt, Inappropriate and Anxious  Thought Process:  Coherent, Goal Directed, Irrelevant, Linear and Descriptions of Associations: Tangential  Orientation:  Full (Time, Place, and Person)  Thought Content: Ilusions, Rumination and Tangential    Assessment and Plan: Psychosupportive psychoeducation  addresses interactively and interpersonally his need for compliance with primary care especially for diabetes treatment and ongoing mental health care especially for schizoaffective.  He does seem improved relative to ADHD and social anxiety taking his Focalin likely half the time and the full dose Rexulti.  He seems to be compliant historically with his Rexulti denying adverse effects but having no labs now possibly since internal medicine in 2019 at [place] not having those data in Epic.  Therefore the session summarizes the immediate need to add primary care and laboratory monitoring to his gym workouts and follow-up of last nutrition and diabetes appointment of 2019.  Patient is E scribed Rexulti 4 mg every morning sent as #90 with 2 refills to [facility] [address] for schizoaffective and social anxiety.  He is E scribed Focalin 30 mg XR twice daily morning and midday sent as #60 each for March 4, April 3, and May 3 to [facility] for ADHD.  Follow Up Instructions:  He returns for follow-up in 6 months or sooner if needed or willing.    I discussed the assessment and treatment plan with the patient. The patient was provided an opportunity to ask questions and all were answered. The patient agreed with the plan and demonstrated an  understanding of the instructions.   The patient was advised to call back or seek an in-person evaluation if the symptoms worsen or if the condition fails to improve as anticipated.  I provided 20 minutes of non-face-to-face time during this encounter. [employer] meeting #[id number] Meeting password: [email or web address]  Delight Hoh, MD   Delight Hoh, MD

## 2019-10-19 ENCOUNTER — Telehealth: Payer: Self-pay | Admitting: Psychiatry

## 2019-10-19 NOTE — Telephone Encounter (Signed)
Return phone call 36 minutes after message left by patient but no answer leaving message as instructed that GABA currently may be a stress relief he is referring to and if so is acceptable with his Rexulti and diagnoses.  If he should have some other stress release company, he will need to leave the name of the gummie if I am to respond to him.

## 2019-10-19 NOTE — Telephone Encounter (Signed)
Jared Potts called wanting to discuss an  OTC stress release medication, in gummie form.  Please call to discuss.

## 2019-10-30 ENCOUNTER — Other Ambulatory Visit: Payer: Self-pay

## 2019-10-30 DIAGNOSIS — F250 Schizoaffective disorder, bipolar type: Secondary | ICD-10-CM

## 2019-10-30 MED ORDER — REXULTI 4 MG PO TABS: 1.0000 | ORAL_TABLET | Freq: Every day | ORAL | 0 refills | Status: DC

## 2019-11-03 ENCOUNTER — Telehealth: Payer: Self-pay | Admitting: Psychiatry

## 2019-11-03 NOTE — Telephone Encounter (Signed)
Contacted pharmacist and patient already had 1 Rx left on file, instructed her to go ahead and fill that today if he's due.

## 2019-11-03 NOTE — Telephone Encounter (Signed)
Pt would like a refill on Dexmethylphenidate. [facility] [address]

## 2019-11-26 ENCOUNTER — Other Ambulatory Visit: Payer: Self-pay

## 2019-11-26 ENCOUNTER — Encounter: Payer: Self-pay | Admitting: Internal Medicine

## 2019-11-26 ENCOUNTER — Ambulatory Visit (INDEPENDENT_AMBULATORY_CARE_PROVIDER_SITE_OTHER): Payer: BC Managed Care – PPO | Admitting: Internal Medicine

## 2019-11-26 VITALS — BP 110/72 | HR 79 | Temp 97.5°F | Resp 17 | Ht 70.0 in | Wt 280.0 lb

## 2019-11-26 DIAGNOSIS — Z114 Encounter for screening for human immunodeficiency virus [HIV]: Secondary | ICD-10-CM

## 2019-11-26 DIAGNOSIS — E1165 Type 2 diabetes mellitus with hyperglycemia: Secondary | ICD-10-CM | POA: Diagnosis not present

## 2019-11-26 DIAGNOSIS — Z0289 Encounter for other administrative examinations: Secondary | ICD-10-CM

## 2019-11-26 DIAGNOSIS — Z7689 Persons encountering health services in other specified circumstances: Secondary | ICD-10-CM

## 2019-11-26 DIAGNOSIS — E782 Mixed hyperlipidemia: Secondary | ICD-10-CM

## 2019-11-26 DIAGNOSIS — Z1159 Encounter for screening for other viral diseases: Secondary | ICD-10-CM

## 2019-11-26 LAB — POCT GLYCOSYLATED HEMOGLOBIN (HGB A1C): Hemoglobin A1C: 5.8 % — AB (ref 4.0–5.6)

## 2019-11-26 NOTE — Patient Instructions (Signed)
Thank you for choosing Primary Care at [facility] to be your medical home!    Jared Potts was seen by De Hollingshead, DO today.   Jared Potts's primary care provider is Marcy Siren, DO.   For the best care possible, you should try to see Marcy Siren, DO whenever you come to the clinic.   We look forward to seeing you again soon!  If you have any questions about your visit today, please call us at [phone] or feel free to reach your primary care provider via MyChart.

## 2019-11-26 NOTE — Progress Notes (Signed)
Subjective:    Jared Potts - 23 y.o. male MRN [id number]  Date of birth: January 28, 1996  HPI  Jared Potts is to establish care. Patient has a PMH significant for T2DM, HLD, ADHD.   Diabetes mellitus, Type 2 Disease Monitoring             Blood Sugar Ranges: Fasting -<130s              Polyuria: no             Visual problems: no   Urine Microalbumin ordered today   Last A1C: 6.6 (May 2019)   Medications: Metformin 1000 mg BID, Farxiga 10 mg daily  Medication Compliance: yes  Medication Side Effects             Hypoglycemia: not symptomatic but has had some numbers in 60-70s    ROS per HPI     Health Maintenance:  Health Maintenance Due  Topic Date Due  . Hepatitis C Screening  Never done  . PNEUMOCOCCAL POLYSACCHARIDE VACCINE AGE 35-64 HIGH RISK  Never done  . COVID-19 Vaccine (1) Never done  . HIV Screening  Never done  . TETANUS/TDAP  Never done  . OPHTHALMOLOGY EXAM  06/28/2017  . URINE MICROALBUMIN  04/12/2018     Past Medical History: Patient Active Problem List   Diagnosis Date Noted  . Circadian rhythm sleep disorder, shift work type 03/09/2018  . Type 2 diabetes mellitus with hyperglycemia, without long-term current use of insulin (HCC) 02/07/2017  . Schizoaffective disorder (HCC)   . Gynecomastia, male   . Combined hyperlipidemia   . Attention deficit hyperactivity disorder (ADHD), combined type, moderate   . Obesity 09/28/2010      Social History   reports that he has never smoked. He has never used smokeless tobacco. He reports current alcohol use. He reports that he does not use drugs.   Family History  family history includes Diabetes in his maternal aunt and maternal grandmother; Hypertension in his father; Obesity in his maternal aunt, maternal grandmother, and mother.   Medications: reviewed and updated   Objective:   Physical Exam BP 110/72   Pulse 79   Temp (!) 97.5 F (36.4 C) (Temporal)   Resp 17   Ht 5\' 10"  (1.778 m)   Wt  (!) 280 lb (127 kg)   SpO2 97%   BMI 40.18 kg/m  Physical Exam Constitutional:      General: He is not in acute distress.    Appearance: He is not diaphoretic.  Cardiovascular:     Rate and Rhythm: Normal rate.  Pulmonary:     Effort: Pulmonary effort is normal. No respiratory distress.  Musculoskeletal:        General: Normal range of motion.  Skin:    General: Skin is warm and dry.  Neurological:     Mental Status: He is alert and oriented to person, place, and time.  Psychiatric:        Mood and Affect: Affect normal.        Judgment: Judgment normal.         Assessment & Plan:   1. Encounter to establish care Reviewed patient's PMH, social history, surgical history, and medications.   2. Type 2 diabetes mellitus with hyperglycemia, without long-term current use of insulin (HCC) A1c is 5.8%. Will discontinue as want to limit risk of hypoglycemia. Continue Metformin.  Counseled on Diabetic diet, my plate method, Marcelline Deist minutes of moderate intensity exercise/week Blood  sugar logs with fasting goals of 80-120 mg/dl, random of less than 329 and in the event of sugars less than 60 mg/dl or greater than 191 mg/dl encouraged to notify the clinic. Advised on the need for annual eye exams, annual foot exams, Pneumonia vaccine. - HgB A1c - Comprehensive metabolic panel - Microalbumin/Creatinine Ratio, Urine - HM Diabetes Foot Exam - Ambulatory referral to Ophthalmology - LDL Cholesterol, Direct  3. Screening for HIV (human immunodeficiency virus) - HIV antibody (with reflex)  4. Need for hepatitis C screening test - Hepatitis C Antibody  5. Combined hyperlipidemia - LDL Cholesterol, Direct  6. Encounter for completion of form with patient Paperwork completed for [facility] for patient to be able to operate motor vehicle.      Marcy Siren, D.O. 11/26/2019, 3:57 PM Primary Care at [facility]

## 2019-11-27 LAB — COMPREHENSIVE METABOLIC PANEL
ALT: 26 IU/L (ref 0–44)
AST: 38 IU/L (ref 0–40)
Albumin/Globulin Ratio: 2.1 (ref 1.2–2.2)
Albumin: 4.8 g/dL (ref 4.1–5.2)
Alkaline Phosphatase: 74 IU/L (ref 48–121)
BUN/Creatinine Ratio: 13 (ref 9–20)
BUN: 15 mg/dL (ref 6–20)
Bilirubin Total: 0.9 mg/dL (ref 0.0–1.2)
CO2: 23 mmol/L (ref 20–29)
Calcium: 10.1 mg/dL (ref 8.7–10.2)
Chloride: 99 mmol/L (ref 96–106)
Creatinine, Ser: 1.18 mg/dL (ref 0.76–1.27)
GFR calc Af Amer: 99 mL/min/{1.73_m2} (ref >59)
GFR calc non Af Amer: 86 mL/min/{1.73_m2} (ref >59)
Globulin, Total: 2.3 g/dL (ref 1.5–4.5)
Glucose: 137 mg/dL — ABNORMAL HIGH (ref 65–99)
Potassium: 4.2 mmol/L (ref 3.5–5.2)
Sodium: 141 mmol/L (ref 134–144)
Total Protein: 7.1 g/dL (ref 6.0–8.5)

## 2019-11-27 LAB — LDL CHOLESTEROL, DIRECT: LDL Direct: 119 mg/dL — ABNORMAL HIGH (ref 0–99)

## 2019-11-27 LAB — HIV ANTIBODY (ROUTINE TESTING W REFLEX): HIV Screen 4th Generation wRfx: NONREACTIVE

## 2019-11-27 LAB — HEPATITIS C ANTIBODY: Hep C Virus Ab: <0.1 s/co ratio (ref 0.0–0.9)

## 2019-11-27 LAB — MICROALBUMIN / CREATININE URINE RATIO
Creatinine, Urine: 77.1 mg/dL
Microalb/Creat Ratio: 4 mg/g creat (ref 0–29)
Microalbumin, Urine: 3.3 ug/mL

## 2019-12-04 NOTE — Progress Notes (Signed)
Normal lab letter mailed.

## 2019-12-24 ENCOUNTER — Telehealth: Payer: Self-pay | Admitting: Psychiatry

## 2019-12-24 ENCOUNTER — Other Ambulatory Visit: Payer: Self-pay

## 2019-12-24 DIAGNOSIS — F902 Attention-deficit hyperactivity disorder, combined type: Secondary | ICD-10-CM

## 2019-12-24 MED ORDER — DEXMETHYLPHENIDATE HCL ER 30 MG PO CP24: 30.0000 mg | ORAL_CAPSULE | Freq: Two times a day (BID) | ORAL | 0 refills | Status: DC

## 2019-12-24 NOTE — Telephone Encounter (Signed)
Pt has an appt with Dr. Marlyne Beards on 8/25. He has not been since since 03/21. He is needing [facility] [address] [place]

## 2019-12-24 NOTE — Telephone Encounter (Signed)
Last refill 11/03/19  Pended for Dr. Marlyne Beards to review. Patient has upcoming apt on 12/30/19

## 2019-12-25 ENCOUNTER — Other Ambulatory Visit: Payer: Self-pay

## 2019-12-25 DIAGNOSIS — F902 Attention-deficit hyperactivity disorder, combined type: Secondary | ICD-10-CM

## 2019-12-25 MED ORDER — DEXMETHYLPHENIDATE HCL ER 30 MG PO CP24: 30.0000 mg | ORAL_CAPSULE | Freq: Two times a day (BID) | ORAL | 0 refills | Status: DC

## 2019-12-25 NOTE — Telephone Encounter (Signed)
[facility] [address] [facility] and [facility].   Pended with updated [facility] and pended for Dr. Marlyne Beards to review and send.

## 2019-12-28 NOTE — Telephone Encounter (Signed)
Error

## 2019-12-30 ENCOUNTER — Telehealth: Payer: Self-pay | Admitting: Psychiatry

## 2019-12-30 ENCOUNTER — Encounter: Payer: Self-pay | Admitting: Psychiatry

## 2019-12-30 ENCOUNTER — Telehealth (INDEPENDENT_AMBULATORY_CARE_PROVIDER_SITE_OTHER): Payer: BC Managed Care – PPO | Admitting: Psychiatry

## 2019-12-30 DIAGNOSIS — F902 Attention-deficit hyperactivity disorder, combined type: Secondary | ICD-10-CM

## 2019-12-30 DIAGNOSIS — F250 Schizoaffective disorder, bipolar type: Secondary | ICD-10-CM | POA: Diagnosis not present

## 2019-12-30 DIAGNOSIS — G4726 Circadian rhythm sleep disorder, shift work type: Secondary | ICD-10-CM | POA: Diagnosis not present

## 2019-12-30 MED ORDER — REXULTI 4 MG PO TABS: 1.0000 | ORAL_TABLET | Freq: Every day | ORAL | 1 refills | Status: DC

## 2019-12-30 MED ORDER — DEXMETHYLPHENIDATE HCL ER 30 MG PO CP24: 30.0000 mg | ORAL_CAPSULE | Freq: Two times a day (BID) | ORAL | 0 refills | Status: DC

## 2019-12-30 NOTE — Progress Notes (Signed)
[facility]  Patient ID: Jared Potts,  MRN: [id number]  PCP: Arvilla Market, DO  Date of Evaluation: 12/30/2019 Time spent:20 minutes 0945 to 1005  Chief Complaint:  Chief Complaint    Depression; Manic Behavior; Hallucinations; ADHD      HISTORY/CURRENT STATUS: Jared Potts is provided telemedicine audiovisual appointment session, though he declines camera due to schizoaffective propensity to misperceptions thereby having virtual office visit, phone to phone 20 minutes with formally documented telehealth consent with epic collateral for psychiatric interview and exam in 29-month evaluation and management of schizoaffective bipolar, ADHD, and circadian rhythm sleep disorder shift work type.  Patient has no interim concerns or complaints last calling for his Focalin August 20 [facility] [address] having completed the 3 eScriptions sent in March with the last having been filled at [facility] to low supply.  Patient did have general medical exam including for his diabetes July 22nd with Dr. Earlene Plater in clearance to drive stopping medication for diabetic hypoglycemia finding hemoglobin A1c 5.8 and LDL cholesterol 119 mg/dL safe to continue his Rexulti p.o. having dietary and exercise modifications recommended.  Patient is working now 2 jobs at [employer] and at [employer] driving to work okay without problem.  Mother is doing well and the patient has not married yet.  [place] registry documents last Focalin dispensing on 12/25/2019.  Patient is adequately attentive on the job though he does take 60 mg total of Focalin daily for ADHD.  Focalin does not exacerbate mood or misperceptions currently with schizoaffective bipolar in partial remission.  Patient is very much the gentleman and appropriate academically in his closure today relative to my retirement in session.  He has no mania, suicidality, psychosis or delirium.  Depression              The patient presents  with schizoaffective bipolar depression as a chronic and recurrentproblem.The most recent depressive episode started 21 months ago. Recurrence quality is sudden. The mood is blunt but manic or depressive problem occurs intermittently.The problem has been gradually improvingsince onset.Associated symptoms include decreased concentration,paranoia, fatigue,and boredom. Associated symptoms include no hopelessness,no helplessness, no insomnia, no irritability,no decreased interest,no body aches,no headaches,no indigestion,no sadnessand no suicidal ideas.The symptoms are aggravated by work stress, family issues, medication and social issues.Past treatments include other medications and psychotherapy.Past compliance problems include difficulty with treatment plan, medication issues and difficulty understanding directions.Risk factors include a change in medication usage/dosage, family history of mental illness and family violence. Past medical history includes anxiety,bipolar disorder,eating disorderand mental health disorder. Pertinent negatives include no physical disability,no recent psychiatric admission,no depression,no obsessive-compulsive disorder,no post-traumatic stress disorder,no suicide attemptsand no head trauma.   Individual Medical History/ Review of Systems: Changes? :Yes  general medical exam including for his diabetes July 22nd with Dr. Earlene Plater in clearance to drive stopping medication for diabetic hypoglycemia finding hemoglobin A1c 5.8 and LDL cholesterol 119 mg/dL.  Direct LDL LDL Cholesterol, Direct Collected: 11/26/19 1609  Result status: Final  Resulting lab: [facility]  Reference range: 0 - 99 mg/dL  Value: 259DGLO   Hemoglobin A1C HgB A1c Collected: 11/26/19 1553  Result status: Final  Reference range: 4.0 - 5.6 %  Value: 5.8Abnormal   Contains abnormal dataComprehensive metabolic panel Order: [id number] Status:  Final result Visible to  patient:  Yes (not seen) Next appt:  None Dx:  Type 2 diabetes mellitus with hypergl...  2 Result Notes   1 Follow-up Encounter  Ref Range & Units 1 mo ago  (  11/26/19) 2 yr ago  (09/16/17) 2 yr ago  (04/12/17) 3 yr ago  (04/27/16)  Glucose 65 - 99 mg/dL [id number]  119JYNW113High R  295AOZH156High CM  236High R   BUN 6 - 20 mg/dL 15  12 R  10 R  16 R   Creatinine, Ser 0.76 - 1.27 mg/dL 0.861.18  5.780.80 R  4.690.85 R  6.290.79 R                   BUN/Creatinine Ratio 9 - 20 13   NOT APPLICABLE R    Sodium 134 - 144 mmol/L 141  138 R  139 R  136 R   Potassium 3.5 - 5.2 mmol/L 4.2  3.9 R  4.0 R  3.9 R   Chloride 96 - 106 mmol/L 99  103 R  104 R  100 R   CO2 20 - 29 mmol/L 23  26 R  25 R  23 R   Calcium 8.7 - 10.2 mg/dL 52.810.1  9.5 R  9.6 R  9.2 R   Total Protein 6.0 - 8.5 g/dL 7.1  7.1 R  6.6 R  7.0 R   Albumin 4.1 - 5.2 g/dL 4.8  4.7 R   4.5 R   Globulin, Total 1.5 - 4.5 g/dL 2.3      Albumin/Globulin Ratio 1.2 - 2.2 2.1      Bilirubin Total 0.0 - 1.2 mg/dL 0.9  1.2 R  4.1LKGM1.5High R  1.5High R   Alkaline Phosphatase 48 - 121 IU/L 74  48 R   79 R   AST 0 - 40 IU/L 38  19 R  15 R  31 R   ALT 0 - 44 IU/L 26  19 R  15 R  26 R         Allergies: Citrus  Current Medications:  Current Outpatient Medications:  .  Brexpiprazole (REXULTI) 4 MG TABS, Take 1 tablet by mouth daily after breakfast., Disp: 90 tablet, Rfl: 1 .  [START ON 01/24/2020] Dexmethylphenidate HCl 30 MG CP24, Take 1 capsule (30 mg total) by mouth 2 (two) times daily., Disp: 60 capsule, Rfl: 0 .  [START ON 02/23/2020] Dexmethylphenidate HCl 30 MG CP24, Take 1 capsule (30 mg total) by mouth 2 (two) times daily., Disp: 60 capsule, Rfl: 0 .  [START ON 03/24/2020] Dexmethylphenidate HCl 30 MG CP24, Take 1 capsule (30 mg total) by mouth 2 (two) times daily., Disp: 60 capsule, Rfl: 0 .  metFORMIN (GLUCOPHAGE) 1000 MG tablet, Take 1,000 mg by mouth 2 (two) times daily with a meal., Disp: , Rfl:   Medication Side Effects: fatigue/weakness and weight gain  likely more effects of of his mental health disorders than the treatment of these.  Family Medical/ Social History: Changes? No patient still suggesting living at home with mother but [place] [address] [place]. Mother has depression, and brother and maternal grandmother have schizoaffective though less severely than patient.  MENTAL HEALTH EXAM:  There were no vitals taken for this visit.There is no height or weight on file to calculate BMI. Not present in office today patient requiring virtual  General Appearance: NA  Eye Contact:  NA  Speech:  Clear and Coherent, Garbled, Normal Rate and Talkative  Volume:  Normal  Mood:  Dysphoric, Euthymic and Irritable  Affect:  Non-Congruent, Constricted, Depressed and Inappropriate  Thought Process:  Coherent, Disorganized, Linear and Descriptions of Associations: Loose  Orientation:  Full (Time, Place, and Person)  Thought  Content: Illogical, Ilusions, Paranoid Ideation and Rumination   Suicidal Thoughts:  No  Homicidal Thoughts:  No  Memory:  Immediate;   Fair Remote;   Fair  Judgement:  Fair  Insight:  Lacking to Fair  Psychomotor Activity:  NA  Concentration:  Concentration: Fair and Attention Span: Poor  Recall:  [employer] of Knowledge: Fair  Language: Fair  Assets:  Desire for Improvement Leisure Time Resilience Vocational/Educational  ADL's:  Intact  Cognition: WNL  Prognosis:  Poor    DIAGNOSES:    ICD-10-CM   1. Schizoaffective disorder, bipolar type (HCC)  F25.0 Brexpiprazole (REXULTI) 4 MG TABS  2. Attention deficit hyperactivity disorder (ADHD), combined type, moderate  F90.2 Dexmethylphenidate HCl 30 MG CP24    Dexmethylphenidate HCl 30 MG CP24    Dexmethylphenidate HCl 30 MG CP24  3. Circadian rhythm sleep disorder, shift work type  G47.26     Receiving Psychotherapy: No    RECOMMENDATIONS: Patient reviews his achievements with employment, family relations, relations with girlfriend, driving, and  resuming adequate metabolic health care.  Psychosupportive psychoeducation reinforces and consolidates such achievements while clarifying the need for ongoing treatment.  Medication update is provided for prevention and monitoring safety hygiene has treatment symptom matching addresses current doses and medications optimal.  Jared Potts registry documents appropriate Focalin without contraindication last dispensing 12/25/2019. Escription is provided for Rexulti 4 mg every morning after breakfast sent as #90 with [facility] refill [facility] [address] for schizoaffective disorder bipolar depressed.  He is E scribed Focalin 30 mg XR twice daily #60 each for September 19, October 19, and November 18 for ADHD [facility].  Patient addresses aftercare resources upon my retirement patient including in this office recently starting primary care with Dr. Earlene Plater acknowledging mutually wishing well with hope and plans for the future.  Follow-up appointment is necessary in 6 months.  Virtual Visit via Telephone Note  I connected with Jared Potts on 01/03/20 at  9:40 AM EDT by telephone and verified that I am speaking with the correct person using two identifiers.  Location: Patient: Phone to phone with privacy at home residence patient declining videocamera for his psychotic disorder. Provider: [facility] office   I discussed the limitations, risks, security and privacy concerns of performing an evaluation and management service by telephone and the availability of in person appointments. I also discussed with the patient that there may be a patient responsible charge related to this service. The patient expressed understanding and agreed to proceed.   History of Present Illness: 61-month evaluation and management address schizoaffective bipolar, ADHD, and circadian rhythm sleep disorder shift work type.  Patient has no interim concerns or complaints    Observations/Objective: Mood:   Dysphoric, Euthymic and Irritable  Affect:  Non-Congruent, Constricted, Depressed and Inappropriate  Thought Process:  Coherent, Disorganized, Linear and Descriptions of Associations: Loose  Orientation:  Full (Time, Place, and Person)  Thought Content: Illogical, Ilusions, Paranoid Ideation and Rumination     Assessment and Plan: Patient reviews his achievements with employment, family relations, relations with girlfriend, driving, and resuming adequate metabolic health care.  Psychosupportive psychoeducation reinforces and consolidates such achievements while clarifying the need for ongoing treatment.  Medication update is provided for prevention and monitoring safety hygiene has treatment symptom matching addresses current doses and medications optimal.  [place] registry documents appropriate Focalin without contraindication last dispensing 12/25/2019. Escription is provided for Rexulti 4 mg every morning after breakfast sent as #90 with 1  refill [facility] [address] for schizoaffective disorder bipolar depressed.  He is E scribed Focalin 30 mg XR twice daily #60 each for September 19, October 19, and November 18 for ADHD [facility].  Follow Up Instructions: Patient addresses aftercare resources upon my retirement patient including in this office recently starting primary care with Dr. Earlene Plater acknowledging mutually wishing well with hope and plans for the future.  Follow-up appointment is necessary in 6 months.   I discussed the assessment and treatment plan with the patient. The patient was provided an opportunity to ask questions and all were answered. The patient agreed with the plan and demonstrated an understanding of the instructions.   The patient was advised to call back or seek an in-person evaluation if the symptoms worsen or if the condition fails to improve as anticipated.  I provided 20 minutes of non-face-to-face time during this encounter.   Chauncey Mann,  MD  Chauncey Mann, MD

## 2019-12-30 NOTE — Telephone Encounter (Signed)
[name], Jared Potts are scheduled for a virtual visit with your provider today.    Just as we do with appointments in the office, we must obtain your consent to participate.  Your consent will be active for this visit and any virtual visit you may have with one of our providers in the next 365 days.    If you have a MyChart account, I can also send a copy of this consent to you electronically.  All virtual visits are billed to your insurance company just like a traditional visit in the office.  As this is a virtual visit, video technology does not allow for your provider to perform a traditional examination.  This may limit your provider's ability to fully assess your condition.  If your provider identifies any concerns that need to be evaluated in person or the need to arrange testing such as labs, EKG, etc, we will make arrangements to do so.    Although advances in technology are sophisticated, we cannot ensure that it will always work on either your end or our end.  If the connection with a video visit is poor, we may have to switch to a telephone visit.  With either a video or telephone visit, we are not always able to ensure that we have a secure connection.   I need to obtain your verbal consent now.   Are you willing to proceed with your visit today?   Jared Potts has provided verbal consent on 12/30/2019 for a virtual visit (video or telephone).   Chauncey Mann, MD 12/30/2019  10:04 AM

## 2020-01-05 LAB — HM DIABETES EYE EXAM

## 2020-02-08 ENCOUNTER — Ambulatory Visit
Admission: EM | Admit: 2020-02-08 | Discharge: 2020-02-08 | Disposition: A | Discharge status: Home / Self Care | Payer: BC Managed Care – PPO | Attending: Emergency Medicine | Admitting: Emergency Medicine

## 2020-02-08 ENCOUNTER — Ambulatory Visit (INDEPENDENT_AMBULATORY_CARE_PROVIDER_SITE_OTHER): Payer: BC Managed Care – PPO

## 2020-02-08 ENCOUNTER — Encounter: Payer: Self-pay | Admitting: Emergency Medicine

## 2020-02-08 DIAGNOSIS — M25532 Pain in left wrist: Secondary | ICD-10-CM

## 2020-02-08 NOTE — ED Provider Notes (Signed)
[facility]    CSN: [id number] Arrival date & time: 02/08/20  1750      History   Chief Complaint Chief Complaint  Patient presents with  . Hand Problem    HPI Jared Potts is a 24 y.o. male  Presenting for left wrist pain.  States he thinks he "got a stress fracture from overusing it" though cannot articulate how.  Denies known mechanism of injury, numbness or deformity.  Has not tried thing for this.  Past Medical History:  Diagnosis Date  . Attention deficit disorder   . Combined hyperlipidemia   . Goiter   . Gynecomastia, male   . Hypertension   . Noncompliance with treatment   . Obesity, Class III, BMI 40-49.9 (morbid obesity) (HCC)   . 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 Thyroiditis, autoimmune   . Type 2 diabetes mellitus in patient age 62-19 years with HbA1C goal below 7.5     Patient Active Problem List   Diagnosis Date Noted  . Circadian rhythm sleep disorder, shift work type 03/09/2018  . Type 2 diabetes mellitus with hyperglycemia, without long-term current use of insulin (HCC) 02/07/2017  . Schizoaffective disorder (HCC)   . Gynecomastia, male   . Combined hyperlipidemia   . Attention deficit hyperactivity disorder (ADHD), combined type, moderate   . Obesity 09/28/2010    Past Surgical History:  Procedure Laterality Date  . CIRCUMCISION         Home Medications    Prior to Admission medications   Medication Sig Start Date End Date Taking? Authorizing Provider  Brexpiprazole (REXULTI) 4 MG TABS Take 1 tablet by mouth daily after breakfast. 12/30/19   Chauncey Mann, MD  Dexmethylphenidate HCl 30 MG CP24 Take 1 capsule (30 mg total) by mouth 2 (two) times daily. 01/24/20 02/23/20  Chauncey Mann, MD  Dexmethylphenidate HCl 30 MG CP24 Take 1 capsule (30 mg total) by mouth 2 (two) times daily. 02/23/20 03/24/20  Chauncey Mann, MD  Dexmethylphenidate HCl 30 MG CP24 Take 1 capsule (30 mg total) by mouth 2 (two) times daily.  03/24/20 04/23/20  Chauncey Mann, MD  metFORMIN (GLUCOPHAGE) 1000 MG tablet Take 1,000 mg by mouth 2 (two) times daily with a meal.    [provider]    Family History Family History  Problem Relation Age of Onset  . Obesity Mother   . Diabetes Maternal Aunt   . Obesity Maternal Aunt   . Diabetes Maternal Grandmother   . Obesity Maternal Grandmother   . Hypertension Father   . Cancer Neg Hx     Social History Social History   Tobacco Use  . Smoking status: Never Smoker  . Smokeless tobacco: Never Used  Vaping Use  . Vaping Use: Never used  Substance Use Topics  . Alcohol use: Yes    Comment: occasionally  . Drug use: No     Allergies   Citrus   Review of Systems As per HPI   Physical Exam Triage Vital Signs ED Triage Vitals  Enc Vitals Group     BP      Pulse      Resp      Temp      Temp src      SpO2      Weight      Height      Head Circumference      Peak Flow      Pain Score  Pain Loc      Pain Edu?      Excl. in GC?    No data found.  Updated Vital Signs BP 121/76 (BP Location: Right Arm)   Pulse 86   Temp 98.1 F (36.7 C)   Resp 20   SpO2 97%   Visual Acuity Right Eye Distance:   Left Eye Distance:   Bilateral Distance:    Right Eye Near:   Left Eye Near:    Bilateral Near:     Physical Exam Constitutional:      General: He is not in acute distress. HENT:     Head: Normocephalic and atraumatic.  Eyes:     General: No scleral icterus.    Pupils: Pupils are equal, round, and reactive to light.  Cardiovascular:     Rate and Rhythm: Normal rate.  Pulmonary:     Effort: Pulmonary effort is normal. No respiratory distress.     Breath sounds: No wheezing.  Musculoskeletal:        General: Tenderness present. No swelling. Normal range of motion.     Comments: Left wrist with mild radial tenderness.  No crepitus or deformity.  Negative snuffbox tenderness.  NVI  Skin:    Coloration: Skin is not jaundiced or  pale.  Neurological:     Mental Status: He is alert and oriented to person, place, and time.      UC Treatments / Results  Labs (all labs ordered are listed, but only abnormal results are displayed) Labs Reviewed - No data to display  EKG   Radiology DG Wrist Complete Left  Result Date: 02/08/2020 CLINICAL DATA:  Wrist pain EXAM: LEFT WRIST - COMPLETE 3+ VIEW COMPARISON:  None. FINDINGS: There is no evidence of fracture or dislocation. There is no evidence of arthropathy or other focal bone abnormality. Soft tissues are unremarkable. IMPRESSION: Negative. Electronically Signed   By: Katherine Mantle M.D.   On: 02/08/2020 19:14    Procedures Procedures (including critical care time)  Medications Ordered in UC Medications - No data to display  Initial Impression / Assessment and Plan / UC Course  I have reviewed the triage vital signs and the nursing notes.  Pertinent labs & imaging results that were available during my care of the patient were reviewed by me and considered in my medical decision making (see chart for details).     Pt requesting XR: negative.  Will tx supportively.  Return precautions discussed, pt verbalized understanding and is agreeable to plan. Final Clinical Impressions(s) / UC Diagnoses   Final diagnoses:  Left wrist pain     Discharge Instructions     RICE: rest, ice, compression, elevation as needed for pain.    Pain medication:  350 mg-1000 mg of Tylenol (acetaminophen) and/or 200 mg - 800 mg of Advil (ibuprofen, Motrin) every 8 hours as needed.  May alternate between the two throughout the day as they are generally safe to take together.  DO NOT exceed more than 3000 mg of Tylenol or 3200 mg of ibuprofen in a 24 hour period as this could damage your stomach, kidneys, liver, or increase your bleeding risk.   Important to follow up with specialist(s) below for further evaluation/management if your symptoms persist or worsen.    ED  Prescriptions    None     PDMP not reviewed this encounter.   Hall[place]Potvin, [place], [place] 02/08/20 1948

## 2020-02-08 NOTE — Discharge Instructions (Addendum)

## 2020-02-24 ENCOUNTER — Encounter: Payer: Self-pay | Admitting: Psychiatry

## 2020-05-03 ENCOUNTER — Telehealth: Payer: Self-pay | Admitting: Psychiatry

## 2020-05-03 DIAGNOSIS — F902 Attention-deficit hyperactivity disorder, combined type: Secondary | ICD-10-CM

## 2020-05-03 MED ORDER — DEXMETHYLPHENIDATE HCL ER 30 MG PO CP24: 30.0000 mg | ORAL_CAPSULE | Freq: Two times a day (BID) | ORAL | 0 refills | Status: DC

## 2020-05-03 NOTE — Telephone Encounter (Signed)
Pt's mom called and said that [place] needs a refill on his focalin 30 mg [facility] [address]. Also who is he suppose to see here to follow up.

## 2020-05-03 NOTE — Telephone Encounter (Signed)
Mother seeks refill Focalin 30mg  XR twice daily #60 last per registry 10/27 apporopriate for reigistry and epic in last year to follow up with 11/27 in 2 months medically necessary no contraindication .

## 2020-05-17 ENCOUNTER — Ambulatory Visit: Payer: BC Managed Care – PPO | Admitting: Adult Health

## 2020-06-10 ENCOUNTER — Encounter: Payer: Self-pay | Admitting: Internal Medicine

## 2020-06-10 ENCOUNTER — Other Ambulatory Visit: Payer: Self-pay

## 2020-06-10 ENCOUNTER — Ambulatory Visit (INDEPENDENT_AMBULATORY_CARE_PROVIDER_SITE_OTHER): Payer: BC Managed Care – PPO | Admitting: Internal Medicine

## 2020-06-10 VITALS — BP 117/75 | HR 80 | Temp 98.0°F | Resp 18 | Ht 72.5 in | Wt 282.0 lb

## 2020-06-10 DIAGNOSIS — E1165 Type 2 diabetes mellitus with hyperglycemia: Secondary | ICD-10-CM | POA: Diagnosis not present

## 2020-06-10 LAB — POCT GLYCOSYLATED HEMOGLOBIN (HGB A1C): Hemoglobin A1C: 11.4 % — AB (ref 4.0–5.6)

## 2020-06-10 LAB — POCT CBG (FASTING - GLUCOSE)-MANUAL ENTRY: Glucose Fasting, POC: 347 mg/dL — AB (ref 70–99)

## 2020-06-10 MED ORDER — GLUCOSE BLOOD VI STRP: ORAL_STRIP | 12 refills | Status: DC

## 2020-06-10 MED ORDER — METFORMIN HCL 1000 MG PO TABS: 1000.0000 mg | ORAL_TABLET | Freq: Two times a day (BID) | ORAL | 1 refills | Status: DC

## 2020-06-10 MED ORDER — MICROLET LANCETS MISC: 1.0000 "application " | Freq: Every day | 0 refills | Status: AC | PRN

## 2020-06-10 MED ORDER — BAYER CONTOUR LINK 2.4 W/DEVICE KIT: 1.0000 | PACK | Freq: Every day | 0 refills | Status: DC | PRN

## 2020-06-10 NOTE — Progress Notes (Signed)
Subjective:    Jared Potts - 24 y.o. male MRN [id number]  Date of birth: 30-Apr-1996  HPI  Jared Potts is here for follow up of chronic medical conditions.  Diabetes mellitus, Type 2 Disease Monitoring             Blood Sugar Ranges: Not monitoring. Needs new meter and supplies.              Polyuria: yes              Visual problems: no   Urine Microalbumin 4 (July 2021)   Last A1C: 5.8 (July 2021)   Medications: Metformin 1000 mg BID  Medication Compliance: no, initially says out for a few weeks but then says maybe longer   Medication Side Effects             Hypoglycemia: no     Health Maintenance:  Health Maintenance Due  Topic Date Due  . OPHTHALMOLOGY EXAM  06/28/2017    -  reports that he has never smoked. He has never used smokeless tobacco. - Review of Systems: Per HPI. - Past Medical History: Patient Active Problem List   Diagnosis Date Noted  . Circadian rhythm sleep disorder, shift work type 03/09/2018  . Type 2 diabetes mellitus with hyperglycemia, without long-term current use of insulin ([place]) 02/07/2017  . Schizoaffective disorder ([place])   . Gynecomastia, male   . Combined hyperlipidemia   . Attention deficit hyperactivity disorder (ADHD), combined type, moderate   . Obesity 09/28/2010   - Medications: reviewed and updated   Objective:   Physical Exam BP 117/75 (BP Location: Right Arm, Patient Position: Sitting, Cuff Size: Large)   Pulse 80   Temp 98 F (36.7 C) (Oral)   Resp 18   Ht 6' 0.5" (1.842 m)   Wt 282 lb (127.9 kg)   SpO2 96%   BMI 37.72 kg/m  Physical Exam Constitutional:      General: He is not in acute distress.    Appearance: He is not diaphoretic.  Cardiovascular:     Rate and Rhythm: Normal rate.  Pulmonary:     Effort: Pulmonary effort is normal. No respiratory distress.  Musculoskeletal:        General: Normal range of motion.  Skin:    General: Skin is warm and dry.  Neurological:     Mental Status: He is  alert and oriented to person, place, and time.  Psychiatric:        Mood and Affect: Affect normal.        Judgment: Judgment normal.            Assessment & Plan:   1. Type 2 diabetes mellitus with hyperglycemia, without long-term current use of insulin (HCC) A1c has essentially doubled in 6 months from result of 5.8>11.4. Suspect related to noncompliance with Metformin. Would also question diet indiscretions. Patient reports he has started a bunch of new weight lifting supplements. Unsure of any carbohydrate content. New diabetic monitoring supplies sent in. Start checking CBGs and keeping log. Resume Metformin immediately. Follow up with Jared Potts, PharmD within one month and bring sugar log. Also asked patient to bring supplements so that nutrition labels and contents can be reviewed.  Counseled on Diabetic diet, my plate method, 388 minutes of moderate intensity exercise/week Blood sugar logs with fasting goals of 80-120 mg/dl, random of less than 180 and in the event of sugars less than 60 mg/dl or greater than 400 mg/dl  encouraged to notify the clinic. Advised on the need for annual eye exams, annual foot exams, Pneumonia vaccine. - HgB A1c - Glucose (CBG), Fasting - Blood Glucose Monitoring Suppl (BAYER CONTOUR LINK 2.4) w/Device KIT; 1 Device by Does not apply route daily as needed.  Dispense: 1 kit; Refill: 0 - glucose blood test strip; Use as instructed  Dispense: 100 each; Refill: 12 - Microlet Lancets MISC; 1 application by Does not apply route daily as needed.  Dispense: 90 each; Refill: 0 - metFORMIN (GLUCOPHAGE) 1000 MG tablet; Take 1 tablet (1,000 mg total) by mouth 2 (two) times daily with a meal.  Dispense: 180 tablet; Refill: 1 - Ambulatory referral to Ophthalmology    Phill Myron, D.O. 06/10/2020, 9:42 AM Primary Care at [facility]

## 2020-06-10 NOTE — Progress Notes (Signed)
Needs metformin refill   Script new glucometer

## 2020-06-10 NOTE — Patient Instructions (Signed)
I sent in your Metformin. Please restart this ASAP. I have also sent in a new meter and supplies to your pharmacy. Start checking your blood sugar at least once every morning prior to eating and write the number down. Bring this and your supplements to your appointment with [name], PharmD.

## 2020-06-13 ENCOUNTER — Other Ambulatory Visit: Payer: Self-pay | Admitting: Internal Medicine

## 2020-06-13 DIAGNOSIS — E1165 Type 2 diabetes mellitus with hyperglycemia: Secondary | ICD-10-CM

## 2020-06-30 ENCOUNTER — Ambulatory Visit
Admission: EM | Admit: 2020-06-30 | Discharge: 2020-06-30 | Disposition: A | Discharge status: Home / Self Care | Payer: BC Managed Care – PPO

## 2020-06-30 ENCOUNTER — Other Ambulatory Visit: Payer: Self-pay

## 2020-06-30 ENCOUNTER — Telehealth: Payer: Self-pay | Admitting: Internal Medicine

## 2020-06-30 NOTE — Telephone Encounter (Signed)
Patient came in today requesting a refill on Farxiga. Patient states his blood sugar today was 291.  Looked in patient chart and I see that his PCP did not prescribe medication. Please f/u

## 2020-07-01 NOTE — Telephone Encounter (Signed)
Per chart review med was discontinued by Dr. Earlene Plater 11/2019, pls review and send refill if appropriate.

## 2020-07-07 ENCOUNTER — Other Ambulatory Visit: Payer: Self-pay

## 2020-07-07 MED ORDER — BLOOD GLUCOSE MONITOR KIT: PACK | 0 refills | Status: AC

## 2020-07-08 ENCOUNTER — Other Ambulatory Visit: Payer: Self-pay

## 2020-07-08 ENCOUNTER — Ambulatory Visit: Payer: BC Managed Care – PPO | Attending: Internal Medicine | Admitting: Pharmacist

## 2020-07-08 ENCOUNTER — Encounter: Payer: Self-pay | Admitting: Pharmacist

## 2020-07-08 DIAGNOSIS — E1165 Type 2 diabetes mellitus with hyperglycemia: Secondary | ICD-10-CM

## 2020-07-08 LAB — GLUCOSE, POCT (MANUAL RESULT ENTRY): POC Glucose: 279 mg/dl — AB (ref 70–99)

## 2020-07-08 MED ORDER — FREESTYLE LIBRE 14 DAY SENSOR MISC: 2 refills | Status: DC

## 2020-07-08 MED ORDER — TRULICITY 0.75 MG/0.5ML ~~LOC~~ SOAJ: 0.7500 mg | SUBCUTANEOUS | 0 refills | Status: DC

## 2020-07-08 MED ORDER — FREESTYLE LIBRE 14 DAY READER DEVI: 2 refills | Status: DC

## 2020-07-08 NOTE — Progress Notes (Signed)
S:    PCP: Dr. Earlene Plater   No chief complaint on file.  Patient arrives in good spirits.  Presents for diabetes evaluation, education, and management. Patient was referred and last seen by Primary Care Provider on 06/10/20. At that visit, his A1c was found to be 11.4. He admitted to being without medications for several weeks.   His mother is with him today. She tells me that she obtained 70/30 insulin OTC at [facility] with intent to lower his CBGs at home given his recent A1c results. He took 20 units at bedtime for several days since his last PCP visit. He stopped Sunday d/t hypogylcemia.   He denies pancreatitis, thyroid cancer. He has no confirmed hx of CHF, CKD, ACS/NSTEMI/STEMI, CAD, or stroke.   Patient reports Diabetes was diagnosed at age 25. His mother confirms that it is not T1DM.  He has been followed by Endocrinology before. He has been on [place] before but does not take these currently. He was on [name] before as well.   Family/Social History:  - FHx: obesity, DM, HTN - Tobacco: never smoker  - Alcohol: occasional use   Insurance coverage/medication affordability: BCBS  Medication adherence reported with metformin. He is self dosing insulin as summarized above.    Current prescribed diabetes medications include: metformin 1000 mg BID Current hypertension medications include: none  Current hyperlipidemia medications include: none   Patient reports hypoglycemic events. Readings as low as 55. He is able to tx successfully. He works two jobs and will sometimes skip meals.   Patient reported dietary habits: . - Skips meals occasionally   Patient-reported exercise habits:  - Exercises 2-3 hours/gym daily    Patient reports polyuria.   Patient reports neuropathy (nerve pain). Patient denies visual changes. Patient reports self foot exams.     O:  POCT: 279 (fasting)  Lab Results  Component Value Date   HGBA1C 11.4 (A) 06/10/2020   There were no  vitals filed for this visit.  Lipid Panel     Component Value Date/Time   CHOL 174 04/12/2017 1613   TRIG 88.0 04/12/2017 1613   HDL 49.90 04/12/2017 1613   CHOLHDL 3 04/12/2017 1613   VLDL 17.6 04/12/2017 1613   LDLCALC 106 (H) 04/12/2017 1613   LDLDIRECT 119 (H) 11/26/2019 1609   Brings his meter.  Home fasting blood sugars: wide range. 55 - 220s   Clinical Atherosclerotic Cardiovascular Disease (ASCVD): No  The ASCVD Risk score Denman George DC Jr., et al., 2013) failed to calculate for the following reasons:   The 2013 ASCVD risk score is only valid for ages 76 to 69   A/P: Diabetes longstanding currently above goal. Patient is able to verbalize appropriate hypoglycemia management plan. Medication adherence appears appropriate with metformin. Instructed him to stop insulin. Emphasized to the pt that we need to prioritize glycemic control with minimizing his risk of hypoglycemia. Will try Trulicity weekly + metformin daily for now before moving to insulin. Pt is amenable to the plan.  -Continued metformin 1000 mg BID.  -Instructed pt to stop OTC insulin.  -Start Trulicity 0.75 mg weekly. Will reassess for dose increase in 1 month.  -Extensively discussed pathophysiology of diabetes, recommended lifestyle interventions, dietary effects on blood sugar control -Counseled on s/sx of and management of hypoglycemia -Next A1C anticipated 09/2020.   Written patient instructions provided.  Total time in face to face counseling 30 minutes.   Follow up Pharmacist Clinic Visit in 1 month.  Benard Halsted, PharmD, Para [place], [place] [phone]

## 2020-07-12 NOTE — Telephone Encounter (Signed)
Appears in interim patient has been seen by [name], PharmD and new medication regimen has been started.   Jared Potts, D.O. Primary Care at [facility]  07/12/2020, 12:56 PM

## 2020-07-16 ENCOUNTER — Other Ambulatory Visit: Payer: Self-pay | Admitting: Internal Medicine

## 2020-07-16 DIAGNOSIS — E1165 Type 2 diabetes mellitus with hyperglycemia: Secondary | ICD-10-CM

## 2020-07-18 NOTE — Telephone Encounter (Signed)
Dexmethylphenidate 30 mg pt checking status of refill

## 2020-07-22 NOTE — Telephone Encounter (Signed)
Needs refill on ADHD medication

## 2020-07-23 ENCOUNTER — Other Ambulatory Visit: Payer: Self-pay | Admitting: Internal Medicine

## 2020-07-23 DIAGNOSIS — E1165 Type 2 diabetes mellitus with hyperglycemia: Secondary | ICD-10-CM

## 2020-07-23 MED ORDER — GLUCOSE BLOOD VI STRP: ORAL_STRIP | 12 refills | Status: AC

## 2020-07-23 NOTE — Telephone Encounter (Signed)
I do not fill his ADHD medication, his psychiatrist does and he needs to ask him for the refills. Will send in the strips.   Marcy Siren, D.O. Primary Care at [facility]  07/23/2020, 12:37 AM

## 2020-07-25 ENCOUNTER — Other Ambulatory Visit: Payer: Self-pay | Admitting: Psychiatry

## 2020-07-25 ENCOUNTER — Telehealth: Payer: Self-pay | Admitting: Adult Health

## 2020-07-25 DIAGNOSIS — F250 Schizoaffective disorder, bipolar type: Secondary | ICD-10-CM

## 2020-07-25 NOTE — Telephone Encounter (Signed)
Pt will need RF of Rexulti and Focalin for the next month.  Pt has appt 4/22. [facility] [address]

## 2020-07-25 NOTE — Telephone Encounter (Signed)
Pt. Dr. Marlyne Beards, he was a no show in Jan. 2022. Scheduled now for April. Okay to send refills?

## 2020-07-25 NOTE — Telephone Encounter (Signed)
There was also a phone msg

## 2020-07-26 ENCOUNTER — Other Ambulatory Visit: Payer: Self-pay

## 2020-07-26 DIAGNOSIS — F902 Attention-deficit hyperactivity disorder, combined type: Secondary | ICD-10-CM

## 2020-07-26 MED ORDER — DEXMETHYLPHENIDATE HCL ER 30 MG PO CP24: 30.0000 mg | ORAL_CAPSULE | Freq: Two times a day (BID) | ORAL | 0 refills | Status: DC

## 2020-07-26 NOTE — Telephone Encounter (Signed)
Ok to send

## 2020-07-26 NOTE — Telephone Encounter (Signed)
Last refill 02/07 Jared Potts will review and send Rx

## 2020-08-05 ENCOUNTER — Ambulatory Visit: Payer: BC Managed Care – PPO | Admitting: Pharmacist

## 2020-08-26 ENCOUNTER — Ambulatory Visit: Payer: BC Managed Care – PPO | Admitting: Adult Health

## 2020-09-01 ENCOUNTER — Encounter: Payer: Self-pay | Admitting: Pharmacist

## 2020-09-01 ENCOUNTER — Ambulatory Visit: Payer: BC Managed Care – PPO | Attending: Family Medicine | Admitting: Pharmacist

## 2020-09-01 ENCOUNTER — Other Ambulatory Visit: Payer: Self-pay

## 2020-09-01 DIAGNOSIS — E1165 Type 2 diabetes mellitus with hyperglycemia: Secondary | ICD-10-CM

## 2020-09-01 LAB — GLUCOSE, POCT (MANUAL RESULT ENTRY): POC Glucose: 120 mg/dl — AB (ref 70–99)

## 2020-09-01 MED ORDER — TRULICITY 1.5 MG/0.5ML ~~LOC~~ SOAJ: 1.5000 mg | SUBCUTANEOUS | 2 refills | Status: DC

## 2020-09-01 NOTE — Progress Notes (Signed)
    S:    PCP: Dr. Earlene Plater   No chief complaint on file.  Patient arrives in good spirits.  Presents for diabetes evaluation, education, and management. Patient was referred and last seen by Primary Care Provider on 06/10/20. I saw him on 07/08/2020 and started Trulicity. I instructed him to stop OTC insulin.  Today, patient denies NV, abdominal pain. Denies any changes in vision. He is tolerating the Trulicity well and feels that this is working well for him. He is no longer taking OTC insulin and is compliant with his metformin.   Family/Social History:  - FHx: obesity, DM, HTN - Tobacco: never smoker  - Alcohol: occasional use   Insurance coverage/medication affordability: BCBS  Medication adherence reported. Current prescribed diabetes medications include: metformin 1000 mg BID Current hypertension medications include: none  Current hyperlipidemia medications include: none   Patient denies hypoglycemic events.   Patient reported dietary habits: . - Skips meals occasionally   Patient-reported exercise habits:  - Exercises 2-3 hours/gym daily    Patient reports polyuria.   Patient reports neuropathy (nerve pain). Patient denies visual changes. Patient reports self foot exams.     O:  POCT: 120  Lab Results  Component Value Date   HGBA1C 11.4 (A) 06/10/2020   There were no vitals filed for this visit.  Lipid Panel     Component Value Date/Time   CHOL 174 04/12/2017 1613   TRIG 88.0 04/12/2017 1613   HDL 49.90 04/12/2017 1613   CHOLHDL 3 04/12/2017 1613   VLDL 17.6 04/12/2017 1613   LDLCALC 106 (H) 04/12/2017 1613   LDLDIRECT 119 (H) 11/26/2019 1609   No meter with him today.   Home fasting blood sugars: reports mostly 100s  Clinical Atherosclerotic Cardiovascular Disease (ASCVD): No  The ASCVD Risk score Denman George DC Jr., et al., 2013) failed to calculate for the following reasons:   The 2013 ASCVD risk score is only valid for ages 50 to 20   A/P: Diabetes  longstanding currently above goal but looks to be improving. Patient is able to verbalize appropriate hypoglycemia management plan. Medication adherence reported. -Continued metformin 1000 mg BID.   -Increased Trulicity 1.5 mg weekly. .  -Extensively discussed pathophysiology of diabetes, recommended lifestyle interventions, dietary effects on blood sugar control -Counseled on s/sx of and management of hypoglycemia -Next A1C anticipated 09/2020.   Written patient instructions provided.  Total time in face to face counseling 30 minutes.   Follow up PCP visit next month.   [name], PharmD, [place]y [place], CPP Clinical Pharmacist [facility] [phone]

## 2020-09-09 ENCOUNTER — Ambulatory Visit: Payer: BC Managed Care – PPO | Admitting: Internal Medicine

## 2020-09-23 ENCOUNTER — Ambulatory Visit: Payer: BC Managed Care – PPO | Admitting: Internal Medicine

## 2020-09-28 ENCOUNTER — Ambulatory Visit: Payer: BC Managed Care – PPO | Admitting: Internal Medicine

## 2020-10-04 ENCOUNTER — Telehealth: Payer: Self-pay | Admitting: Behavioral Health

## 2020-10-04 NOTE — Telephone Encounter (Signed)
Mom called to request refill of [name].  Has appt 6/2.  [name] was told he had to wait until his appt.  He has missed or no showed for two appts.  No medication will be refilled until he is seen.  And he must be seen in person.

## 2020-10-04 NOTE — Telephone Encounter (Signed)
Received.  Agreed.He must be seen especially with no shows.

## 2020-10-04 NOTE — Telephone Encounter (Signed)
FYI

## 2020-10-06 ENCOUNTER — Ambulatory Visit (INDEPENDENT_AMBULATORY_CARE_PROVIDER_SITE_OTHER): Payer: BC Managed Care – PPO | Admitting: Behavioral Health

## 2020-10-06 ENCOUNTER — Other Ambulatory Visit: Payer: Self-pay

## 2020-10-06 ENCOUNTER — Encounter: Payer: Self-pay | Admitting: Behavioral Health

## 2020-10-06 VITALS — BP 119/76 | HR 64 | Ht 73.0 in | Wt 267.0 lb

## 2020-10-06 DIAGNOSIS — F902 Attention-deficit hyperactivity disorder, combined type: Secondary | ICD-10-CM | POA: Diagnosis not present

## 2020-10-06 DIAGNOSIS — F250 Schizoaffective disorder, bipolar type: Secondary | ICD-10-CM

## 2020-10-06 MED ORDER — DEXMETHYLPHENIDATE HCL ER 30 MG PO CP24: 30.0000 mg | ORAL_CAPSULE | Freq: Every day | ORAL | 0 refills | Status: DC

## 2020-10-06 MED ORDER — REXULTI 4 MG PO TABS: 4.0000 mg | ORAL_TABLET | Freq: Every day | ORAL | 3 refills | Status: DC

## 2020-10-06 NOTE — Progress Notes (Signed)
[facility]  Patient ID: CRIXUS MCAULAY,  MRN: [id number]  PCP: Nicolette Bang, DO  Date of Evaluation: 10/06/2020 Time spent:30 minutes  Chief Complaint:  Chief Complaint    Anxiety; Depression; Follow-up; Medication Refill; Schizoaffective disorder      HISTORY/CURRENT STATUS: HPI  25 year old male presents to this office for follow up and medication management.  He says that, "I feel pretty good when I am taking my meds but I had recently run out". He expresses that he does not need to make any medication changes at this time. He reports anxiety at 2 and depression at 1. Says he is sleeping 7-8 hours per night and feels rested. No mania, no psychosis. No SI/HI.   Individual Medical History/ Review of Systems: Changes? :No   Allergies: Citrus  Current Medications:  Current Outpatient Medications:  .  blood glucose meter kit and supplies KIT, *ONETOUCH ULTRA* Use to test blood sugar daily as needed, Disp: 1 each, Rfl: 0 .  Blood Glucose Monitoring Suppl (CONTOUR NEXT LINK) w/Device KIT, USE TO TEST BLOOD SUGAR DAILY AS NEEDED, Disp: 1 kit, Rfl: 0 .  Brexpiprazole (REXULTI) 4 MG TABS, Take 4 mg by mouth daily after breakfast., Disp: 30 tablet, Rfl: 3 .  Continuous Blood Gluc Receiver (FREESTYLE LIBRE 14 DAY READER) DEVI, Use to check blood sugar TID. E11.65, Disp: 1 each, Rfl: 2 .  Continuous Blood Gluc Sensor (FREESTYLE LIBRE 14 DAY SENSOR) MISC, Use to check blood sugar TID. E11.65, Disp: 1 each, Rfl: 2 .  Dexmethylphenidate HCl 30 MG CP24, Take 1 capsule (30 mg total) by mouth daily after breakfast., Disp: 30 capsule, Rfl: 0 .  [START ON 11/05/2020] Dexmethylphenidate HCl 30 MG CP24, Take 1 capsule (30 mg total) by mouth daily after breakfast., Disp: 30 capsule, Rfl: 0 .  Dulaglutide (TRULICITY) 1.5 EX/5.2WU SOPN, Inject 1.5 mg into the skin once a week., Disp: 2 mL, Rfl: 2 .  glucose blood test strip, Use as instructed, Disp: 100 each, Rfl: 12 .  metFORMIN  (GLUCOPHAGE) 1000 MG tablet, Take 1 tablet (1,000 mg total) by mouth 2 (two) times daily with a meal., Disp: 180 tablet, Rfl: 1 .  Microlet Lancets MISC, 1 application by Does not apply route daily as needed., Disp: 90 each, Rfl: 0 Medication Side Effects: none  Family Medical/ Social History: Changes? no  MENTAL HEALTH EXAM:  There were no vitals taken for this visit.There is no height or weight on file to calculate BMI.  General Appearance: Fairly Groomed  Eye Contact:  Fair  Speech:  Clear and Coherent  Volume:  Normal  Mood:  Dysphoric  Affect:  Flat  Thought Process:  Disorganized  Orientation:  Full (Time, Place, and Person)  Thought Content: Logical   Suicidal Thoughts:  No  Homicidal Thoughts:  No  Memory:  WNL  Judgement:  Fair  Insight:  Fair  Psychomotor Activity:  Decreased  Concentration:  Concentration: Fair  Recall:  Good  Fund of Knowledge: Good  Language: Good  Assets:  Desire for Improvement Social Support  ADL's:  Intact  Cognition: WNL  Prognosis:  Fair    DIAGNOSES:    ICD-10-CM   1. Attention deficit hyperactivity disorder (ADHD), combined type  F90.2 Dexmethylphenidate HCl 30 MG CP24    Dexmethylphenidate HCl 30 MG CP24  2. Schizoaffective disorder, bipolar type ([place])  F25.0 Brexpiprazole (REXULTI) 4 MG TABS    Receiving Psychotherapy: no   RECOMMENDATIONS:  Will continue Rexulti 4  mg tab daily Will continue dexmethylphenidate 30 mg daily Will report any side effect or worsening symptoms Agreed to every three month follow up while taking stimulants Discussed potential benefits, risks, and side effects of stimulants with patient to include increased heart rate, palpitations, insomnia, increased anxiety, increased irritability, or decreased appetite.  Instructed patient to contact office if experiencing any significant tolerability issues. Greater than 50% of 30 min. face to face time with patient was spent on counseling and coordination of  care. We discussed patients current mental status and struggles with fatigue. Patient did not want to make medication changes at this time. Counseled patient on making sure he calls one week prior to running out of medications to avoid relapse.   Elwanda Brooklyn, NP

## 2020-10-10 ENCOUNTER — Ambulatory Visit: Payer: BC Managed Care – PPO | Admitting: Behavioral Health

## 2020-10-19 ENCOUNTER — Ambulatory Visit
Admission: EM | Admit: 2020-10-19 | Discharge: 2020-10-19 | Disposition: A | Discharge status: Home / Self Care | Payer: BC Managed Care – PPO | Attending: Emergency Medicine | Admitting: Emergency Medicine

## 2020-10-19 ENCOUNTER — Ambulatory Visit (INDEPENDENT_AMBULATORY_CARE_PROVIDER_SITE_OTHER): Payer: BC Managed Care – PPO

## 2020-10-19 ENCOUNTER — Other Ambulatory Visit: Payer: Self-pay

## 2020-10-19 DIAGNOSIS — M79671 Pain in right foot: Secondary | ICD-10-CM | POA: Diagnosis not present

## 2020-10-19 DIAGNOSIS — S92344A Nondisplaced fracture of fourth metatarsal bone, right foot, initial encounter for closed fracture: Secondary | ICD-10-CM

## 2020-10-19 MED ORDER — NAPROXEN 500 MG PO TABS: 500.0000 mg | ORAL_TABLET | Freq: Two times a day (BID) | ORAL | 0 refills | Status: AC

## 2020-10-19 NOTE — Discharge Instructions (Addendum)
Please use crutches and do not bear bear weight on foot Naprosyn twice daily for pain and swelling Ice and elevate Follow-up with sports medicine

## 2020-10-19 NOTE — ED Triage Notes (Signed)
One week h/o right lateral and dorsum foot pain with swelling. Pt reports that he ambulates with a limp because his right LE is shorter than the left. He is concerned for a stress fracture, noting that he is on his feet a lot working. No meds taken. Pt has a h/o type 2 DM. Notes fungus on both feet. No bleeding or open. No falls or injuries noted.

## 2020-10-19 NOTE — ED Provider Notes (Signed)
[facility]    CSN: [id number] Arrival date & time: 10/19/20  1548      History   Chief Complaint Chief Complaint  Patient presents with   Foot Pain    right    HPI Jared Potts is a 25 y.o. male history of DM type II, schizoaffective disorder, presenting today for evaluation of right foot pain.  Reports over the past week has had increased pain to his right foot.  Reports that he has a chronic shortening of his right leg and because of this often is limping causing increased pressure throughout his right limb.  He reports prior stress fractures to this foot and expresses concern about this today.  Denies any recent injury fall or trauma.  Denies any redness or fevers.  HPI  Past Medical History:  Diagnosis Date   Attention deficit disorder    Combined hyperlipidemia    Goiter    Gynecomastia, male    Hypertension    Noncompliance with treatment    Obesity, Class III, BMI 40-49.9 (morbid obesity) ([place])    Schizophrenia in children    Thyroiditis, autoimmune    Type 2 diabetes mellitus in patient age 80-19 years with HbA1C goal below 7.5     Patient Active Problem List   Diagnosis Date Noted   Circadian rhythm sleep disorder, shift work type 03/09/2018   Type 2 diabetes mellitus with hyperglycemia, without long-term current use of insulin ([place]) 02/07/2017   Schizoaffective disorder ([place])    Gynecomastia, male    Combined hyperlipidemia    Attention deficit hyperactivity disorder (ADHD), combined type, moderate    Obesity 09/28/2010    Past Surgical History:  Procedure Laterality Date   CIRCUMCISION         Home Medications    Prior to Admission medications   Medication Sig Start Date End Date Taking? Authorizing Provider  naproxen (NAPROSYN) 500 MG tablet Take 1 tablet (500 mg total) by mouth 2 (two) times daily. 10/19/20  Yes Clarity Ciszek C, PA-C  blood glucose meter kit and supplies KIT *ONETOUCH ULTRA* Use to test blood sugar daily as  needed 07/07/20   Nicolette Bang, MD  Blood Glucose Monitoring Suppl (CONTOUR NEXT LINK) w/Device KIT USE TO TEST BLOOD SUGAR DAILY AS NEEDED 06/15/20   Nicolette Bang, MD  Brexpiprazole (REXULTI) 4 MG TABS Take 4 mg by mouth daily after breakfast. 10/06/20   Elwanda Brooklyn, NP  Continuous Blood Gluc Receiver (FREESTYLE LIBRE 14 DAY READER) DEVI Use to check blood sugar TID. E11.65 07/08/20   Nicolette Bang, MD  Continuous Blood Gluc Sensor (FREESTYLE LIBRE 14 DAY SENSOR) MISC Use to check blood sugar TID. E11.65 07/08/20   Nicolette Bang, MD  Dexmethylphenidate HCl 30 MG CP24 Take 1 capsule (30 mg total) by mouth daily after breakfast. 10/06/20 11/04/20  Elwanda Brooklyn, NP  Dexmethylphenidate HCl 30 MG CP24 Take 1 capsule (30 mg total) by mouth daily after breakfast. 11/05/20 12/05/20  Elwanda Brooklyn, NP  Dulaglutide (TRULICITY) 1.5 ER/1.5QM SOPN Inject 1.5 mg into the skin once a week. 09/01/20   Nicolette Bang, MD  glucose blood test strip Use as instructed 07/23/20   Nicolette Bang, MD  metFORMIN (GLUCOPHAGE) 1000 MG tablet Take 1 tablet (1,000 mg total) by mouth 2 (two) times daily with a meal. 06/10/20   Nicolette Bang, MD  Microlet Lancets MISC 1 application by Does not apply route daily as needed. 06/10/20  Nicolette Bang, MD    Family History Family History  Problem Relation Age of Onset   Obesity Mother    Diabetes Maternal Aunt    Obesity Maternal Aunt    Diabetes Maternal Grandmother    Obesity Maternal Grandmother    Hypertension Father    Cancer Neg Hx     Social History Social History   Tobacco Use   Smoking status: Never   Smokeless tobacco: Never  Vaping Use   Vaping Use: Never used  Substance Use Topics   Alcohol use: Yes    Comment: occasionally   Drug use: No     Allergies   Citrus   Review of Systems Review of Systems  Constitutional:  Negative for fatigue and fever.  Eyes:  Negative  for redness, itching and visual disturbance.  Respiratory:  Negative for shortness of breath.   Cardiovascular:  Negative for chest pain and leg swelling.  Gastrointestinal:  Negative for nausea and vomiting.  Musculoskeletal:  Positive for arthralgias. Negative for myalgias.  Skin:  Negative for color change, rash and wound.  Neurological:  Negative for dizziness, syncope, weakness, light-headedness and headaches.    Physical Exam Triage Vital Signs ED Triage Vitals  Enc Vitals Group     BP      Pulse      Resp      Temp      Temp src      SpO2      Weight      Height      Head Circumference      Peak Flow      Pain Score      Pain Loc      Pain Edu?      Excl. in [place]?    No data found.  Updated Vital Signs BP 132/78 (BP Location: Left Arm)   Pulse 82   Temp 98.2 F (36.8 C) (Oral)   Resp 18   SpO2 95%   Visual Acuity Right Eye Distance:   Left Eye Distance:   Bilateral Distance:    Right Eye Near:   Left Eye Near:    Bilateral Near:     Physical Exam Vitals and nursing note reviewed.  Constitutional:      Appearance: He is well-developed.     Comments: No acute distress  HENT:     Head: Normocephalic and atraumatic.     Nose: Nose normal.  Eyes:     Conjunctiva/sclera: Conjunctivae normal.  Cardiovascular:     Rate and Rhythm: Normal rate.  Pulmonary:     Effort: Pulmonary effort is normal. No respiratory distress.  Abdominal:     General: There is no distension.  Musculoskeletal:        General: Normal range of motion.     Cervical back: Neck supple.     Comments: Right foot: No obvious swelling or deformity, tenderness to palpation to proximal lateral dorsum of foot, dorsalis pedis 2+  Skin:    General: Skin is warm and dry.  Neurological:     Mental Status: He is alert and oriented to person, place, and time.     UC Treatments / Results  Labs (all labs ordered are listed, but only abnormal results are displayed) Labs Reviewed - No data  to display  EKG   Radiology DG Foot Complete Right  Result Date: 10/19/2020 CLINICAL DATA:  25 year old male with right foot swelling. EXAM: RIGHT FOOT COMPLETE - 3+ VIEW COMPARISON:  Right  foot radiograph dated 08/05/2017. FINDINGS: There is a nondisplaced transverse fracture of the proximal fourth metatarsal. No other acute fracture. No dislocation. Degenerative changes of the articulation of the navicular and cuboid. The soft tissues are unremarkable. IMPRESSION: Nondisplaced fracture of the proximal fourth metatarsal. Electronically Signed   By: Anner Crete M.D.   On: 10/19/2020 17:22    Procedures Procedures (including critical care time)  Medications Ordered in UC Medications - No data to display  Initial Impression / Assessment and Plan / UC Course  I have reviewed the triage vital signs and the nursing notes.  Pertinent labs & imaging results that were available during my care of the patient were reviewed by me and considered in my medical decision making (see chart for details).     Nondisplaced fracture of fourth metatarsal, placing an Ace wrap and postop shoe and will have nonweightbearing, follow-up with sports medicine.  Ice elevate and anti-inflammatories.  Discussed strict return precautions. Patient verbalized understanding and is agreeable with plan.  Final Clinical Impressions(s) / UC Diagnoses   Final diagnoses:  Closed nondisplaced fracture of fourth metatarsal bone of right foot, initial encounter     Discharge Instructions      Please use crutches and do not bear bear weight on foot Naprosyn twice daily for pain and swelling Ice and elevate Follow-up with sports medicine      ED Prescriptions     Medication Sig Dispense Auth. Provider   naproxen (NAPROSYN) 500 MG tablet Take 1 tablet (500 mg total) by mouth 2 (two) times daily. 30 tablet Prescious Hurless, [place] C, PA-C      PDMP not reviewed this encounter.   Janith Lima, [place] 10/19/20  1759

## 2020-12-07 ENCOUNTER — Ambulatory Visit: Payer: BC Managed Care – PPO | Admitting: Behavioral Health

## 2020-12-12 ENCOUNTER — Encounter: Payer: Self-pay | Admitting: Behavioral Health

## 2020-12-12 ENCOUNTER — Ambulatory Visit (INDEPENDENT_AMBULATORY_CARE_PROVIDER_SITE_OTHER): Payer: BC Managed Care – PPO | Admitting: Behavioral Health

## 2020-12-12 ENCOUNTER — Other Ambulatory Visit: Payer: Self-pay

## 2020-12-12 DIAGNOSIS — G4726 Circadian rhythm sleep disorder, shift work type: Secondary | ICD-10-CM

## 2020-12-12 DIAGNOSIS — F250 Schizoaffective disorder, bipolar type: Secondary | ICD-10-CM | POA: Diagnosis not present

## 2020-12-12 DIAGNOSIS — F902 Attention-deficit hyperactivity disorder, combined type: Secondary | ICD-10-CM

## 2020-12-12 MED ORDER — DEXMETHYLPHENIDATE HCL ER 30 MG PO CP24: 30.0000 mg | ORAL_CAPSULE | Freq: Every day | ORAL | 0 refills | Status: DC

## 2020-12-12 MED ORDER — REXULTI 3 MG PO TABS: 3.0000 mg | ORAL_TABLET | Freq: Every day | ORAL | 5 refills | Status: DC

## 2020-12-12 NOTE — Progress Notes (Signed)
[facility]  Patient ID: Jared Potts,  MRN: [id number]  PCP: Nicolette Bang, MD  Date of Evaluation: 12/12/2020 Time spent:20 minutes  Chief Complaint:   HISTORY/CURRENT STATUS: HPI 25 year old male presents to this office for follow up and medication management. He says that he has been doing well for the most part but feels like the Rexulti 4 mg is effecting his speech and making him sluggish. Says he feels like he has hard time getting his words out. He would like to reduce Rexulti to 3 mg daily. Says the Lenda Kelp is working great for his ADHD symptoms and his job is going good. He is still working night shift. He reports no anxiety or depression this visit. He sleeps 7 plus hours per night. No mania, no psychosis, No SI/HI.  Pt did not know his past psychiatric medication trials.  Individual Medical History/ Review of Systems: Changes? :No   Allergies: Citrus  Current Medications:  Current Outpatient Medications:    Brexpiprazole (REXULTI) 3 MG TABS, Take 1 tablet (3 mg total) by mouth daily after breakfast., Disp: 30 tablet, Rfl: 5   [START ON 01/12/2021] Dexmethylphenidate HCl 30 MG CP24, Take 1 capsule (30 mg total) by mouth daily after breakfast., Disp: 30 capsule, Rfl: 0   blood glucose meter kit and supplies KIT, *ONETOUCH ULTRA* Use to test blood sugar daily as needed, Disp: 1 each, Rfl: 0   Blood Glucose Monitoring Suppl (CONTOUR NEXT LINK) w/Device KIT, USE TO TEST BLOOD SUGAR DAILY AS NEEDED, Disp: 1 kit, Rfl: 0   Continuous Blood Gluc Receiver (FREESTYLE LIBRE 14 DAY READER) DEVI, Use to check blood sugar TID. E11.65, Disp: 1 each, Rfl: 2   Continuous Blood Gluc Sensor (FREESTYLE LIBRE 14 DAY SENSOR) MISC, Use to check blood sugar TID. E11.65, Disp: 1 each, Rfl: 2   Dexmethylphenidate HCl 30 MG CP24, Take 1 capsule (30 mg total) by mouth daily after breakfast., Disp: 30 capsule, Rfl: 0   Dulaglutide (TRULICITY) 1.5 TZ/0.0FV SOPN, Inject 1.5 mg into  the skin once a week., Disp: 2 mL, Rfl: 2   glucose blood test strip, Use as instructed, Disp: 100 each, Rfl: 12   metFORMIN (GLUCOPHAGE) 1000 MG tablet, Take 1 tablet (1,000 mg total) by mouth 2 (two) times daily with a meal., Disp: 180 tablet, Rfl: 1   Microlet Lancets MISC, 1 application by Does not apply route daily as needed., Disp: 90 each, Rfl: 0   naproxen (NAPROSYN) 500 MG tablet, Take 1 tablet (500 mg total) by mouth 2 (two) times daily., Disp: 30 tablet, Rfl: 0 Medication Side Effects: none  Family Medical/ Social History: Changes? No  MENTAL HEALTH EXAM:  There were no vitals taken for this visit.There is no height or weight on file to calculate BMI.  General Appearance: Casual  Eye Contact:  Good  Speech:  Slow  Volume:  Decreased  Mood:  NA  Affect:  Flat  Thought Process:  Coherent  Orientation:  Full (Time, Place, and Person)  Thought Content: Logical   Suicidal Thoughts:  No  Homicidal Thoughts:  No  Memory:  WNL  Judgement:  Good  Insight:  Good  Psychomotor Activity:  Normal  Concentration:  Concentration: Fair  Recall:  [place] of Knowledge: Good  Language: Good  Assets:  Desire for Improvement  ADL's:  Intact  Cognition: WNL  Prognosis:  Good    DIAGNOSES:    ICD-10-CM   1. Attention deficit hyperactivity disorder (ADHD),  combined type  F90.2 Brexpiprazole (REXULTI) 3 MG TABS    Dexmethylphenidate HCl 30 MG CP24    Dexmethylphenidate HCl 30 MG CP24      Receiving Psychotherapy: No    RECOMMENDATIONS:  Will reduce Rexulti 4 mg tab  to 3 mg daily per patient request. He feels like the medication is affecting his speech and making him sluggish.  Will continue dexmethylphenidate 30 mg daily Will report any side effect or worsening symptoms Greater than 50% of  20 min face to face time with patient was spent on counseling and coordination of care. We discussed his improvements with his moods. Other than reducing his Rexulti, he is happy with his  medications at this time and no other changes are indicated.  Agreed to every three month follow up while taking stimulants Discussed potential benefits, risks, and side effects of stimulants with patient to include increased heart rate, palpitations, insomnia, increased anxiety, increased irritability, or decreased appetite.  Instructed patient to contact office if experiencing any significant tolerability issues. Greater than 50% of 30 min. face to face time with patient was spent on counseling and coordination of care. We discussed patients current mental status and struggles with fatigue. Patient did not want to make medication changes at this time. Counseled patient on making sure he calls one week prior to running out of medications to avoid relapse.   Elwanda Brooklyn, NP

## 2020-12-24 ENCOUNTER — Other Ambulatory Visit: Payer: Self-pay | Admitting: Adult Health

## 2020-12-24 DIAGNOSIS — F250 Schizoaffective disorder, bipolar type: Secondary | ICD-10-CM

## 2021-03-01 ENCOUNTER — Other Ambulatory Visit: Payer: Self-pay

## 2021-03-01 ENCOUNTER — Telehealth: Payer: Self-pay | Admitting: Behavioral Health

## 2021-03-01 DIAGNOSIS — F902 Attention-deficit hyperactivity disorder, combined type: Secondary | ICD-10-CM

## 2021-03-01 MED ORDER — DEXMETHYLPHENIDATE HCL ER 30 MG PO CP24: 30.0000 mg | ORAL_CAPSULE | Freq: Every day | ORAL | 0 refills | Status: DC

## 2021-03-01 NOTE — Telephone Encounter (Signed)
Pended.

## 2021-03-01 NOTE — Telephone Encounter (Signed)
Patient needs refill for Dexmethylphenidate 30mg . Ph: [phone]. Appt 11/8. Pharmacy [facility] [address] 13/8

## 2021-03-14 ENCOUNTER — Encounter: Payer: Self-pay | Admitting: Behavioral Health

## 2021-03-14 ENCOUNTER — Other Ambulatory Visit: Payer: Self-pay

## 2021-03-14 ENCOUNTER — Ambulatory Visit (INDEPENDENT_AMBULATORY_CARE_PROVIDER_SITE_OTHER): Payer: BC Managed Care – PPO | Admitting: Behavioral Health

## 2021-03-14 DIAGNOSIS — F250 Schizoaffective disorder, bipolar type: Secondary | ICD-10-CM

## 2021-03-14 DIAGNOSIS — F902 Attention-deficit hyperactivity disorder, combined type: Secondary | ICD-10-CM | POA: Diagnosis not present

## 2021-03-14 DIAGNOSIS — G4726 Circadian rhythm sleep disorder, shift work type: Secondary | ICD-10-CM | POA: Diagnosis not present

## 2021-03-14 MED ORDER — DEXMETHYLPHENIDATE HCL ER 30 MG PO CP24: 30.0000 mg | ORAL_CAPSULE | Freq: Every day | ORAL | 0 refills | Status: DC

## 2021-03-14 MED ORDER — REXULTI 3 MG PO TABS: 3.0000 mg | ORAL_TABLET | Freq: Every day | ORAL | 5 refills | Status: DC

## 2021-03-14 NOTE — Progress Notes (Signed)
[facility]  Patient ID: Jared Potts,  MRN: [id number]  PCP: Nicolette Bang, MD  Date of Evaluation: 03/14/2021 Time spent:30 minutes  Chief Complaint:  Chief Complaint   ADHD; Anxiety; Follow-up; Medication Refill; Patient Education     HISTORY/CURRENT STATUS: HPI 25 year old male presents to this office for follow up and medication management. He says that he has been doing well for the most part but still is having problems managing medications and calling when he is running low on his script. He says that his behaviors at work change and he often gets frustrated with small issues that he make bigger in his mind. Says that he worries sometimes about speaking his thoughts out loud, and getting into altercations at work. He says his speech and problems getting words out has improved since reducing the Rexulti down to 2 mg. Says the Lenda Kelp is working great for his ADHD symptoms and his job is going good. He does not want to adjust any of his medications at this time.  He is still working night shift. He reports no anxiety or depression this visit. He sleeps 7 plus hours per night. No mania, no psychosis, No SI/HI.   Pt did not know his past psychiatric medication trials   Individual Medical History/ Review of Systems: Changes? :No   Allergies: Citrus  Current Medications:  Current Outpatient Medications:    [START ON 05/01/2021] Dexmethylphenidate HCl 30 MG CP24, Take 1 capsule (30 mg total) by mouth daily., Disp: 30 capsule, Rfl: 0   blood glucose meter kit and supplies KIT, *ONETOUCH ULTRA* Use to test blood sugar daily as needed, Disp: 1 each, Rfl: 0   Blood Glucose Monitoring Suppl (CONTOUR NEXT LINK) w/Device KIT, USE TO TEST BLOOD SUGAR DAILY AS NEEDED, Disp: 1 kit, Rfl: 0   Brexpiprazole (REXULTI) 3 MG TABS, Take 1 tablet (3 mg total) by mouth daily after breakfast., Disp: 30 tablet, Rfl: 5   Continuous Blood Gluc Receiver (FREESTYLE LIBRE 14 DAY  READER) DEVI, Use to check blood sugar TID. E11.65, Disp: 1 each, Rfl: 2   Continuous Blood Gluc Sensor (FREESTYLE LIBRE 14 DAY SENSOR) MISC, Use to check blood sugar TID. E11.65, Disp: 1 each, Rfl: 2   Dexmethylphenidate HCl 30 MG CP24, Take 1 capsule (30 mg total) by mouth daily after breakfast., Disp: 30 capsule, Rfl: 0   [START ON 04/01/2021] Dexmethylphenidate HCl 30 MG CP24, Take 1 capsule (30 mg total) by mouth daily after breakfast., Disp: 30 capsule, Rfl: 0   Dulaglutide (TRULICITY) 1.5 CZ/6.6AY SOPN, Inject 1.5 mg into the skin once a week., Disp: 2 mL, Rfl: 2   glucose blood test strip, Use as instructed, Disp: 100 each, Rfl: 12   metFORMIN (GLUCOPHAGE) 1000 MG tablet, Take 1 tablet (1,000 mg total) by mouth 2 (two) times daily with a meal., Disp: 180 tablet, Rfl: 1   Microlet Lancets MISC, 1 application by Does not apply route daily as needed., Disp: 90 each, Rfl: 0   naproxen (NAPROSYN) 500 MG tablet, Take 1 tablet (500 mg total) by mouth 2 (two) times daily., Disp: 30 tablet, Rfl: 0 Medication Side Effects: none  Family Medical/ Social History: Changes? No  MENTAL HEALTH EXAM:  There were no vitals taken for this visit.There is no height or weight on file to calculate BMI.  General Appearance: Casual and Neat  Eye Contact:  Good  Speech:  Clear and Coherent  Volume:  Normal  Mood:  Anxious  Affect:  Appropriate  Thought Process:  Coherent  Orientation:  Full (Time, Place, and Person)  Thought Content: Logical   Suicidal Thoughts:  No  Homicidal Thoughts:  No  Memory:  WNL  Judgement:  Good  Insight:  Good  Psychomotor Activity:  Normal  Concentration:  Concentration: Fair  Recall:  [place] of Knowledge: Fair  Language: Fair  Assets:  Desire for Improvement Resilience  ADL's:  Intact  Cognition: WNL  Prognosis:  Fair    DIAGNOSES:    ICD-10-CM   1. Schizoaffective disorder, bipolar type ([place])  F25.0     2. Attention deficit hyperactivity disorder (ADHD),  combined type  F90.2 Dexmethylphenidate HCl 30 MG CP24    Dexmethylphenidate HCl 30 MG CP24    Brexpiprazole (REXULTI) 3 MG TABS    3. Attention deficit hyperactivity disorder (ADHD), combined type, moderate  F90.2     4. Circadian rhythm sleep disorder, shift work type  G47.26       Receiving Psychotherapy: No    RECOMMENDATIONS:    Greater than 50% of 30 min. face to face time with patient was spent on counseling and coordination of care. We discussed patients current mental status and struggles with fatigue. Patient did not want to make medication changes at this time. Counseled patient on making sure he calls one week prior to running out of medications to avoid relapse.  Will reduce Rexulti 4 mg tab  to 3 mg daily per patient request. He feels like the medication is affecting his speech and making him sluggish.  Will continue dexmethylphenidate 30 mg daily Will report any side effect or worsening symptoms Greater than 50% of  30 min face to face time with patient was spent on counseling and coordination of care. We discussed his improvements with his moods. Other than reducing his Rexulti, he is happy with his medications at this time and no other changes are indicated. We did discuss him running out of medications repeatedly. He is not managing his scripts when he gets low on medications. He runs out of meds and then experiences changes in his behavior at work. I stressed the importance of notifying this office in advance.  Agreed to every three month follow up while taking stimulants Discussed potential benefits, risks, and side effects of stimulants with patient to include increased heart rate, palpitations, insomnia, increased anxiety, increased irritability, or decreased appetite.  Instructed patient to contact office if experiencing any significant tolerability issues.         Elwanda Brooklyn, NP

## 2021-06-14 ENCOUNTER — Other Ambulatory Visit: Payer: Self-pay

## 2021-06-14 ENCOUNTER — Encounter: Payer: Self-pay | Admitting: Behavioral Health

## 2021-06-14 ENCOUNTER — Ambulatory Visit (INDEPENDENT_AMBULATORY_CARE_PROVIDER_SITE_OTHER): Payer: BC Managed Care – PPO | Admitting: Behavioral Health

## 2021-06-14 DIAGNOSIS — F902 Attention-deficit hyperactivity disorder, combined type: Secondary | ICD-10-CM | POA: Diagnosis not present

## 2021-06-14 MED ORDER — DEXMETHYLPHENIDATE HCL ER 30 MG PO CP24: 30.0000 mg | ORAL_CAPSULE | Freq: Every day | ORAL | 0 refills | Status: DC

## 2021-06-14 MED ORDER — REXULTI 3 MG PO TABS: 3.0000 mg | ORAL_TABLET | Freq: Every day | ORAL | 5 refills | Status: DC

## 2021-06-14 NOTE — Progress Notes (Signed)
[facility]  Patient ID: SANDERS MANNINEN,  MRN: [id number]  PCP: Nicolette Bang, MD  Date of Evaluation: 06/14/2021 Time spent:30 minutes  Chief Complaint:  Chief Complaint   Anxiety; Depression; Follow-up; Medication Problem; Medication Refill     HISTORY/CURRENT STATUS: HPI  26 year old male presents to this office for follow up and medication management. He says that he has been doing well for the most part but still is having problems managing medications and calling when he is running low on his script. He says that his behaviors at work change and he often gets frustrated with small issues that he make bigger in his mind. He is changed positions at work due to some conflict. Says that he is a peaceful person but sometime feels turmoil or conflict in his mind. He has recently started meditation using crystals. He would like to consider yoga or mindfulness. Says the Lenda Kelp is working great for his ADHD symptoms and his job is going good. He does not want to adjust any of his medications at this time.  He is still working night shift. He reports no anxiety or depression this visit. He sleeps 7 plus hours per night. No mania, no psychosis, No SI/HI.   Pt did not know his past psychiatric medication trials        Individual Medical History/ Review of Systems: Changes? :No   Allergies: Citrus  Current Medications:  Current Outpatient Medications:    blood glucose meter kit and supplies KIT, *ONETOUCH ULTRA* Use to test blood sugar daily as needed, Disp: 1 each, Rfl: 0   Blood Glucose Monitoring Suppl (CONTOUR NEXT LINK) w/Device KIT, USE TO TEST BLOOD SUGAR DAILY AS NEEDED, Disp: 1 kit, Rfl: 0   Brexpiprazole (REXULTI) 3 MG TABS, Take 1 tablet (3 mg total) by mouth daily after breakfast., Disp: 30 tablet, Rfl: 5   Continuous Blood Gluc Receiver (FREESTYLE LIBRE 14 DAY READER) DEVI, Use to check blood sugar TID. E11.65, Disp: 1 each, Rfl: 2   Continuous  Blood Gluc Sensor (FREESTYLE LIBRE 14 DAY SENSOR) MISC, Use to check blood sugar TID. E11.65, Disp: 1 each, Rfl: 2   Dexmethylphenidate HCl 30 MG CP24, Take 1 capsule (30 mg total) by mouth daily after breakfast., Disp: 30 capsule, Rfl: 0   Dexmethylphenidate HCl 30 MG CP24, Take 1 capsule (30 mg total) by mouth daily after breakfast., Disp: 30 capsule, Rfl: 0   Dexmethylphenidate HCl 30 MG CP24, Take 1 capsule (30 mg total) by mouth daily., Disp: 30 capsule, Rfl: 0   Dulaglutide (TRULICITY) 1.5 EG/3.1DV SOPN, Inject 1.5 mg into the skin once a week., Disp: 2 mL, Rfl: 2   glucose blood test strip, Use as instructed, Disp: 100 each, Rfl: 12   metFORMIN (GLUCOPHAGE) 1000 MG tablet, Take 1 tablet (1,000 mg total) by mouth 2 (two) times daily with a meal., Disp: 180 tablet, Rfl: 1   Microlet Lancets MISC, 1 application by Does not apply route daily as needed., Disp: 90 each, Rfl: 0   naproxen (NAPROSYN) 500 MG tablet, Take 1 tablet (500 mg total) by mouth 2 (two) times daily., Disp: 30 tablet, Rfl: 0 Medication Side Effects: none  Family Medical/ Social History: Changes? No  MENTAL HEALTH EXAM:  There were no vitals taken for this visit.There is no height or weight on file to calculate BMI.  General Appearance: Casual and Neat  Eye Contact:  Good  Speech:  Clear and Coherent  Volume:  Normal  Mood:  NA  Affect:  Appropriate  Thought Process:  Coherent  Orientation:  Full (Time, Place, and Person)  Thought Content: Logical   Suicidal Thoughts:  No  Homicidal Thoughts:  No  Memory:  WNL  Judgement:  Good  Insight:  Good  Psychomotor Activity:  Normal  Concentration:  Concentration: Good  Recall:  Good  Fund of Knowledge: Good  Language: Good  Assets:  Desire for Improvement  ADL's:  Intact  Cognition: WNL  Prognosis:  Good    DIAGNOSES:    ICD-10-CM   1. Attention deficit hyperactivity disorder (ADHD), combined type  F90.2 Dexmethylphenidate HCl 30 MG CP24    Brexpiprazole  (REXULTI) 3 MG TABS      Receiving Psychotherapy: No    RECOMMENDATIONS:   Greater than 50% of 30 min. face to face time with patient was spent on counseling and coordination of care. We discussed patients current mental status and struggles with fatigue. Patient did not want to make medication changes at this time. Counseled patient on making sure he calls one week prior to running out of medications to avoid relapse.  *To order labs next visit if he has not followed up with PCP   Continue Rexulti 3 mg daily Will continue dexmethylphenidate 30 mg daily Will report any side effect or worsening symptoms Greater than 50% of  30 min face to face time with patient was spent on counseling and coordination of care. We discussed his improvements with his moods. Other than reducing his Rexulti, he is happy with his medications at this time and no other changes are indicated. We did discuss him running out of medications repeatedly. He is not managing his scripts when he gets low on medications. He runs out of meds and then experiences changes in his behavior at work. I stressed the importance of notifying this office in advance.  He is now doing daily meditation with crystals and would like to consider mindfulness and yoga. Agreed to every three month follow up while taking stimulants Discussed potential benefits, risks, and side effects of stimulants with patient to include increased heart rate, palpitations, insomnia, increased anxiety, increased irritability, or decreased appetite.  Instructed patient to contact office if experiencing any significant tolerability issues.            Elwanda Brooklyn, NP

## 2021-07-19 ENCOUNTER — Telehealth: Payer: Self-pay | Admitting: Internal Medicine

## 2021-07-19 NOTE — Telephone Encounter (Signed)
Copied from [place] [phone]. Topic: General - Other ?>> Jul 17, 2021  2:42 PM McGill, Nelva Bush wrote: ?Reason for CRM: Pt requesting a call back from pharmacist [place]. Pt would like to schedule an appointment with him. ? ?Please advise. ?

## 2021-07-19 NOTE — Telephone Encounter (Signed)
Sorry friend I cannot see this patient until he is reestablished with his PCP. He was last under the care of Dr. Juleen China. He will need to be seen by a new PCP before I can see him. ?

## 2021-07-22 ENCOUNTER — Other Ambulatory Visit: Payer: Self-pay | Admitting: Internal Medicine

## 2021-07-22 DIAGNOSIS — E1165 Type 2 diabetes mellitus with hyperglycemia: Secondary | ICD-10-CM

## 2021-07-24 ENCOUNTER — Other Ambulatory Visit: Payer: Self-pay | Admitting: *Deleted

## 2021-07-24 ENCOUNTER — Other Ambulatory Visit: Payer: Self-pay | Admitting: Psychiatry

## 2021-07-24 DIAGNOSIS — E1165 Type 2 diabetes mellitus with hyperglycemia: Secondary | ICD-10-CM

## 2021-07-24 MED ORDER — METFORMIN HCL 1000 MG PO TABS: 1000.0000 mg | ORAL_TABLET | Freq: Two times a day (BID) | ORAL | 0 refills | Status: DC

## 2021-07-25 ENCOUNTER — Other Ambulatory Visit: Payer: Self-pay | Admitting: *Deleted

## 2021-07-25 DIAGNOSIS — E1165 Type 2 diabetes mellitus with hyperglycemia: Secondary | ICD-10-CM

## 2021-07-25 MED ORDER — METFORMIN HCL 1000 MG PO TABS: 1000.0000 mg | ORAL_TABLET | Freq: Two times a day (BID) | ORAL | 0 refills | Status: DC

## 2021-08-10 IMAGING — DX DG WRIST COMPLETE 3+V*L*
4 series · 4 of 4 positions shown · non-contrast
Comparison: None.

CLINICAL DATA: Wrist pain

EXAM:
LEFT WRIST - COMPLETE 3+ VIEW

[wrist pa (1 of 3)]
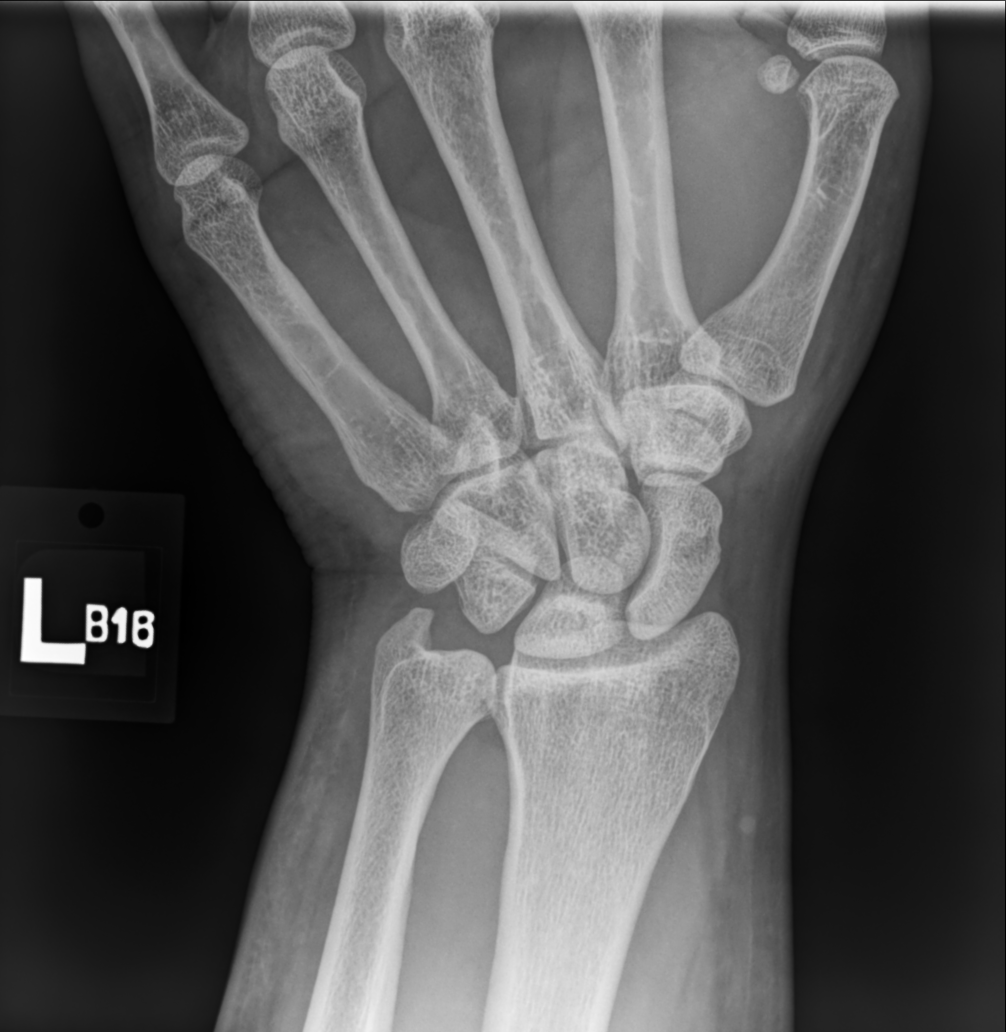

[wrist pa (2 of 3)]
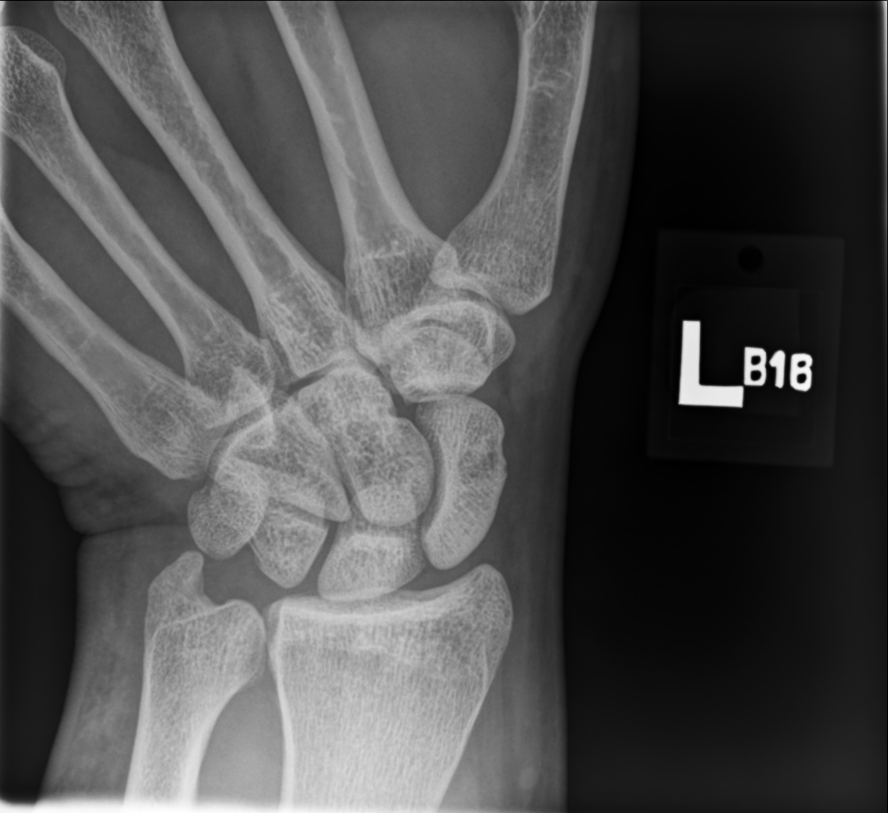

[wrist pa (3 of 3)]
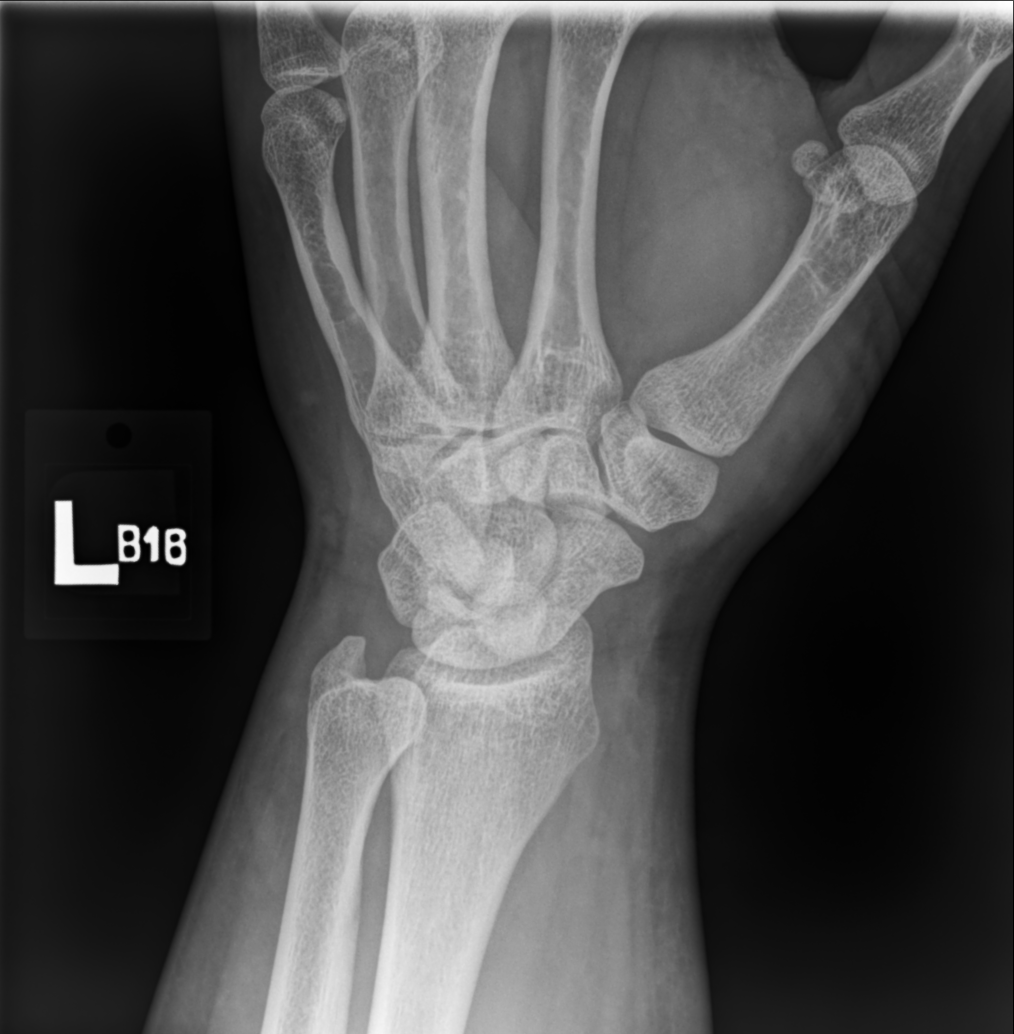

[wrist lat]
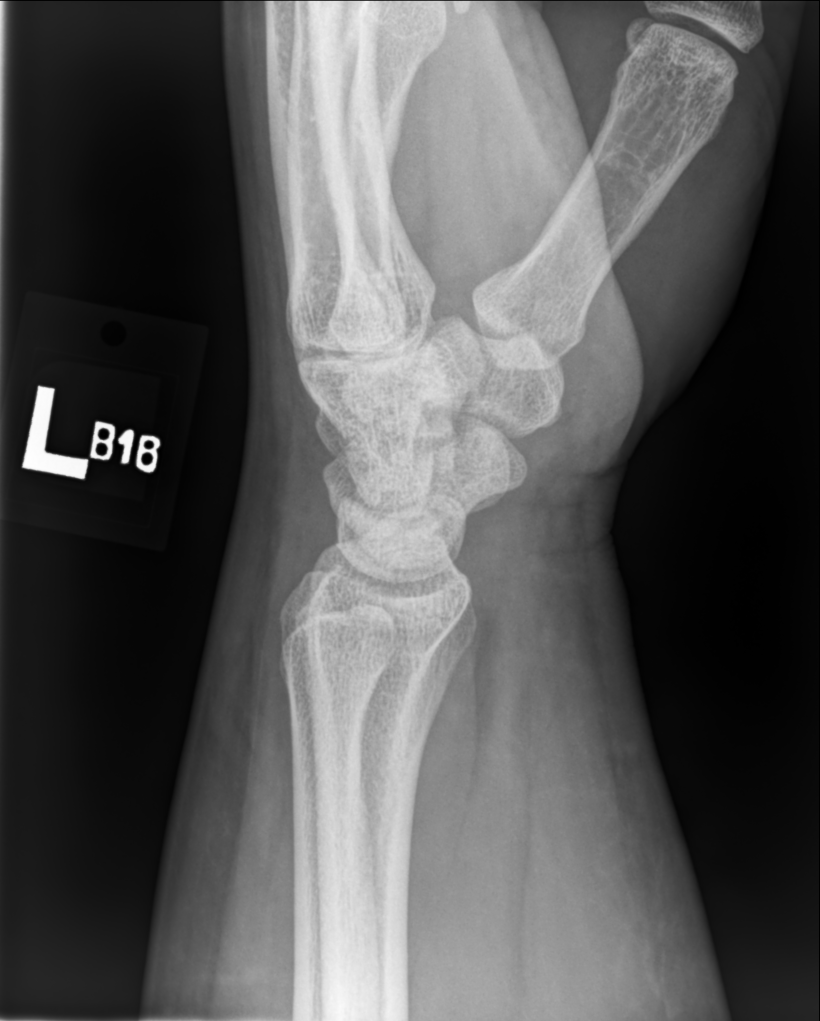

[4 of 4 positions shown; findings below may reference images not displayed]

FINDINGS: There is no evidence of fracture or dislocation. There is no
evidence of arthropathy or other focal bone abnormality. Soft
tissues are unremarkable.
IMPRESSION: Negative.

## 2021-08-17 ENCOUNTER — Other Ambulatory Visit: Payer: Self-pay | Admitting: Behavioral Health

## 2021-08-17 ENCOUNTER — Telehealth: Payer: Self-pay | Admitting: Behavioral Health

## 2021-08-17 DIAGNOSIS — F902 Attention-deficit hyperactivity disorder, combined type: Secondary | ICD-10-CM

## 2021-08-17 MED ORDER — DEXMETHYLPHENIDATE HCL ER 30 MG PO CP24: 30.0000 mg | ORAL_CAPSULE | Freq: Every day | ORAL | 0 refills | Status: DC

## 2021-08-17 NOTE — Telephone Encounter (Signed)
RX complete, sent to [facility] [address]

## 2021-08-17 NOTE — Telephone Encounter (Signed)
Pt requesting Rx for Dexmethylphenidate HCI 30 mg CP 24 to [facility] [address] ?

## 2021-08-17 NOTE — Telephone Encounter (Signed)
duplicate

## 2021-09-08 ENCOUNTER — Ambulatory Visit: Payer: BC Managed Care – PPO | Admitting: Behavioral Health

## 2021-09-08 NOTE — Progress Notes (Signed)
? ? ?Patient ID: Jared Potts, male    DOB: May 16, 1995  MRN: [id number] ? ?CC: Diabetes Follow-Up ? ?Subjective: ?Jared Potts is a 26 y.o. male who presents for diabetes follow-up.  ? ?His concerns today include:  ?Diabetes type 2 follow-up: ?06/10/2020 with Phill Myron, DO: ?A1c has essentially doubled in 6 months from result of 5.8>11.4. Suspect related to noncompliance with Metformin. Would also question diet indiscretions. Patient reports he has started a bunch of new weight lifting supplements. Unsure of any carbohydrate content. New diabetic monitoring supplies sent in. Start checking CBGs and keeping log. Resume Metformin immediately. Follow up with Benard Halsted, PharmD within one month and bring sugar log. Also asked patient to bring supplements so that nutrition labels and contents can be reviewed.  ?Counseled on Diabetic diet, my plate method, 409 minutes of moderate intensity exercise/week ?Blood sugar logs with fasting goals of 80-120 mg/dl, random of less than 180 and in the event of sugars less than 60 mg/dl or greater than 400 mg/dl encouraged to notify the clinic. ?Advised on the need for annual eye exams, annual foot exams, Pneumonia vaccine. ?- HgB A1c ?- Glucose (CBG), Fasting ?- Blood Glucose Monitoring Suppl (BAYER CONTOUR LINK 2.4) w/Device KIT; 1 Device by Does not apply route daily as needed.  Dispense: 1 kit; Refill: 0 ?- glucose blood test strip; Use as instructed  Dispense: 100 each; Refill: 12 ?- Microlet Lancets MISC; 1 application by Does not apply route daily as needed.  Dispense: 90 each; Refill: 0 ?- metFORMIN (GLUCOPHAGE) 1000 MG tablet; Take 1 tablet (1,000 mg total) by mouth 2 (two) times daily with a meal.  Dispense: 180 tablet; Refill: 1 ?- Ambulatory referral to Ophthalmology ?  ?09/11/2021: ?Doing well on current regimen. Requesting medication refills. Does not check blood sugars at home. Reports he does not eat a lot so doesn't understand why his diabetes is  uncontrolled. Reports he doesn't drink sodas. Drinks a lot of water. Reports this morning ate 2 waffles. Exercises routinely. Wants to gain muscle. Asking for advisement on what foods he should eat to increase muscle that doesn't cause diabetes to worsen. Reports was followed by Endocrinology in the past.  ?  ? ?Patient Active Problem List  ? Diagnosis Date Noted  ? Circadian rhythm sleep disorder, shift work type 03/09/2018  ? Type 2 diabetes mellitus with hyperglycemia, without long-term current use of insulin ([place]) 02/07/2017  ? Schizoaffective disorder ([place])   ? Gynecomastia, male   ? Combined hyperlipidemia   ? Attention deficit hyperactivity disorder (ADHD), combined type, moderate   ? Obesity 09/28/2010  ?  ? ?Current Outpatient Medications on File Prior to Visit  ?Medication Sig Dispense Refill  ? blood glucose meter kit and supplies KIT *ONETOUCH ULTRA* Use to test blood sugar daily as needed 1 each 0  ? Blood Glucose Monitoring Suppl (CONTOUR NEXT LINK) w/Device KIT USE TO TEST BLOOD SUGAR DAILY AS NEEDED 1 kit 0  ? Brexpiprazole (REXULTI) 3 MG TABS Take 1 tablet (3 mg total) by mouth daily after breakfast. 30 tablet 5  ? Dexmethylphenidate HCl 30 MG CP24 Take 1 capsule (30 mg total) by mouth daily. 30 capsule 0  ? glucose blood test strip Use as instructed 100 each 12  ? Microlet Lancets MISC 1 application by Does not apply route daily as needed. 90 each 0  ? naproxen (NAPROSYN) 500 MG tablet Take 1 tablet (500 mg total) by mouth 2 (two) times daily. 30 tablet 0  ? ?  No current facility-administered medications on file prior to visit.  ? ? ?Allergies  ?Allergen Reactions  ? Citrus   ?  Pt stated, "breaks out in blisters on fingers"  ? ? ?Social History  ? ?Socioeconomic History  ? Marital status: Single  ?  Spouse name: Not on file  ? Number of children: Not on file  ? Years of education: Not on file  ? Highest education level: Not on file  ?Occupational History  ? Not on file  ?Tobacco Use  ? Smoking  status: Never  ?  Passive exposure: Never  ? Smokeless tobacco: Never  ?Vaping Use  ? Vaping Use: Never used  ?Substance and Sexual Activity  ? Alcohol use: Yes  ?  Comment: occasionally  ? Drug use: No  ? Sexual activity: Not Currently  ?Other Topics Concern  ? Not on file  ?Social History Narrative  ? Lives with mom and dad and brother. Likes to make music, hip-hop and rap. Participates in recording.  ? ?Social Determinants of Health  ? ?Financial Resource Strain: Not on file  ?Food Insecurity: Not on file  ?Transportation Needs: Not on file  ?Physical Activity: Not on file  ?Stress: Not on file  ?Social Connections: Not on file  ?Intimate Partner Violence: Not on file  ? ? ?Family History  ?Problem Relation Age of Onset  ? Obesity Mother   ? Diabetes Maternal Aunt   ? Obesity Maternal Aunt   ? Diabetes Maternal Grandmother   ? Obesity Maternal Grandmother   ? Hypertension Father   ? Cancer Neg Hx   ? ? ?Past Surgical History:  ?Procedure Laterality Date  ? CIRCUMCISION    ? ? ?ROS: ?Review of Systems ?Negative except as stated above ? ?PHYSICAL EXAM: ?BP 115/74 (BP Location: Left Arm, Patient Position: Sitting, Cuff Size: Large)   Pulse 88   Temp 98.3 ?F (36.8 ?C)   Resp 18   Ht _0  (1.803 m)   Wt 245 lb (111.1 kg)   SpO2 96%   BMI 34.17 kg/m?  ? ?Physical Exam ?HENT:  ?   Head: Normocephalic and atraumatic.  ?Eyes:  ?   Extraocular Movements: Extraocular movements intact.  ?   Conjunctiva/sclera: Conjunctivae normal.  ?   Pupils: Pupils are equal, round, and reactive to light.  ?Cardiovascular:  ?   Rate and Rhythm: Normal rate and regular rhythm.  ?   Pulses: Normal pulses.  ?   Heart sounds: Normal heart sounds.  ?Pulmonary:  ?   Effort: Pulmonary effort is normal.  ?   Breath sounds: Normal breath sounds.  ?Musculoskeletal:  ?   Cervical back: Normal range of motion and neck supple.  ?Neurological:  ?   General: No focal deficit present.  ?   Mental Status: He is alert and oriented to person, place,  and time.  ?Psychiatric:     ?   Mood and Affect: Mood normal.     ?   Behavior: Behavior normal.  ? ?Results for orders placed or performed in visit on 09/11/21  ?POCT glycosylated hemoglobin (Hb A1C)  ?Result Value Ref Range  ? Hemoglobin A1C 11.6 (A) 4.0 - 5.6 %  ? HbA1c POC (<> result, manual entry)    ? HbA1c, POC (prediabetic range)    ? HbA1c, POC (controlled diabetic range)    ? ? ?ASSESSMENT AND PLAN: ?1. Type 2 diabetes mellitus with hyperglycemia, without long-term current use of insulin ([place]): ?- Hemoglobin A1c today not at  goal at 11.6%, goal < 7%. ?- Continue Metformin as prescribed.  ?- Increase Dulaglutide from 1.5 mg weekly to 3 mg weekly.  ?- Discussed the importance of healthy eating habits, low-carbohydrate diet, low-sugar diet, regular aerobic exercise (at least 150 minutes a week as tolerated) and medication compliance to achieve or maintain control of diabetes. ?- Referral to Medical Nutrition Therapy for further evaluation and management. ?- Referral to Endocrinology for further evaluation and management.  ?- Follow-up with primary provider in 4 weeks or sooner if needed pending appointment with Endocrinology.  ?- POCT glycosylated hemoglobin (Hb A1C) ?- Dulaglutide 3 MG/0.5ML SOPN; Inject 3 mg into the skin once a week.  Dispense: 6 mL; Refill: 0 ?- metFORMIN (GLUCOPHAGE) 1000 MG tablet; Take 1 tablet (1,000 mg total) by mouth 2 (two) times daily with a meal.  Dispense: 180 tablet; Refill: 0 ?- Amb ref to Medical Nutrition Therapy-MNT ?- Ambulatory referral to Endocrinology ?- Continuous Blood Gluc Receiver (FREESTYLE LIBRE 14 DAY READER) DEVI; Use to check blood sugar TID. E11.65  Dispense: 1 each; Refill: 5 ?- Continuous Blood Gluc Sensor (FREESTYLE LIBRE 14 DAY SENSOR) MISC; Use to check blood sugar TID. E11.65  Dispense: 1 each; Refill: 5 ?- Continuous Blood Gluc Receiver (FREESTYLE LIBRE READER) DEVI; 1 kit by Does not apply route 4 (four) times daily -  before meals and at bedtime.   Dispense: 1 each; Refill: 0 ? ? ? ?Patient was given the opportunity to ask questions.  Patient verbalized understanding of the plan and was able to repeat key elements of the plan. Patient was given clear instructions

## 2021-09-11 ENCOUNTER — Ambulatory Visit (INDEPENDENT_AMBULATORY_CARE_PROVIDER_SITE_OTHER): Payer: BC Managed Care – PPO | Admitting: Family

## 2021-09-11 ENCOUNTER — Encounter: Payer: Self-pay | Admitting: Family

## 2021-09-11 VITALS — BP 115/74 | HR 88 | Temp 98.3°F | Resp 18 | Ht 71.0 in | Wt 245.0 lb

## 2021-09-11 DIAGNOSIS — E1165 Type 2 diabetes mellitus with hyperglycemia: Secondary | ICD-10-CM

## 2021-09-11 LAB — POCT GLYCOSYLATED HEMOGLOBIN (HGB A1C): Hemoglobin A1C: 11.6 % — AB (ref 4.0–5.6)

## 2021-09-11 MED ORDER — DULAGLUTIDE 3 MG/0.5ML ~~LOC~~ SOAJ: 3.0000 mg | SUBCUTANEOUS | 0 refills | Status: AC

## 2021-09-11 MED ORDER — FREESTYLE LIBRE 14 DAY READER DEVI: 5 refills | Status: DC

## 2021-09-11 MED ORDER — FREESTYLE LIBRE 14 DAY SENSOR MISC: 5 refills | Status: DC

## 2021-09-11 MED ORDER — METFORMIN HCL 1000 MG PO TABS: 1000.0000 mg | ORAL_TABLET | Freq: Two times a day (BID) | ORAL | 0 refills | Status: DC

## 2021-09-11 MED ORDER — FREESTYLE LIBRE READER DEVI: 1.0000 | Freq: Three times a day (TID) | 0 refills | Status: DC

## 2021-09-11 NOTE — Progress Notes (Signed)
Diabetes discussed in office.

## 2021-09-11 NOTE — Progress Notes (Signed)
Pt presents for diabetes follow-up needs refill on Trulicity and Freestyle libre  ?

## 2021-09-12 ENCOUNTER — Other Ambulatory Visit: Payer: Self-pay | Admitting: Family

## 2021-09-12 DIAGNOSIS — E1165 Type 2 diabetes mellitus with hyperglycemia: Secondary | ICD-10-CM

## 2021-09-14 ENCOUNTER — Telehealth: Payer: Self-pay | Admitting: Internal Medicine

## 2021-09-14 NOTE — Telephone Encounter (Addendum)
[facility] state the referral sent to them did not have any lab work attached.  They cannot schedule the pt until they get his recent labs. ? ?Fax: [phone] ? ?Cb [phone]  ext 100 ?

## 2021-09-21 ENCOUNTER — Ambulatory Visit (INDEPENDENT_AMBULATORY_CARE_PROVIDER_SITE_OTHER): Payer: BC Managed Care – PPO | Admitting: Behavioral Health

## 2021-09-21 ENCOUNTER — Encounter: Payer: Self-pay | Admitting: Behavioral Health

## 2021-09-21 DIAGNOSIS — F902 Attention-deficit hyperactivity disorder, combined type: Secondary | ICD-10-CM | POA: Diagnosis not present

## 2021-09-21 DIAGNOSIS — F250 Schizoaffective disorder, bipolar type: Secondary | ICD-10-CM

## 2021-09-21 DIAGNOSIS — G4726 Circadian rhythm sleep disorder, shift work type: Secondary | ICD-10-CM | POA: Diagnosis not present

## 2021-09-21 MED ORDER — DEXMETHYLPHENIDATE HCL ER 40 MG PO CP24: 40.0000 mg | ORAL_CAPSULE | Freq: Every day | ORAL | 0 refills | Status: DC

## 2021-09-21 NOTE — Progress Notes (Signed)
[facility]  Patient ID: REGNALD BOWENS,  MRN: [id number]  PCP: Nicolette Bang, MD  Date of Evaluation: 09/21/2021 Time spent:30 minutes  Chief Complaint:  Chief Complaint   Anxiety; Follow-up; Medication Refill; Medication Problem     HISTORY/CURRENT STATUS: HPI  26 year old male presents to this office for follow up and medication management. He says that he has been doing well for the most part but still is having problems managing medications and calling when he is running low on his script. He is concerned about his mental fogginess when his dexmethylphenidate starts to wain in the afternoon. He would like to consider dosage  He is still working night shift. He reports no anxiety or depression this visit. He sleeps 7 plus hours per night. No mania, no psychosis, No SI/HI.   Pt did not know his past psychiatric medication trials   Individual Medical History/ Review of Systems: Changes? :No   Allergies: Citrus  Current Medications:  Current Outpatient Medications:    blood glucose meter kit and supplies KIT, *ONETOUCH ULTRA* Use to test blood sugar daily as needed, Disp: 1 each, Rfl: 0   Blood Glucose Monitoring Suppl (CONTOUR NEXT LINK) w/Device KIT, USE TO TEST BLOOD SUGAR DAILY AS NEEDED, Disp: 1 kit, Rfl: 0   Brexpiprazole (REXULTI) 3 MG TABS, Take 1 tablet (3 mg total) by mouth daily after breakfast., Disp: 30 tablet, Rfl: 5   Continuous Blood Gluc Receiver (FREESTYLE LIBRE 14 DAY READER) DEVI, Use to check blood sugar TID. E11.65, Disp: 1 each, Rfl: 5   Continuous Blood Gluc Receiver (FREESTYLE LIBRE READER) DEVI, 1 kit by Does not apply route 4 (four) times daily -  before meals and at bedtime., Disp: 1 each, Rfl: 0   Continuous Blood Gluc Sensor (FREESTYLE LIBRE 14 DAY SENSOR) MISC, Use to check blood sugar TID. E11.65, Disp: 1 each, Rfl: 5   Dexmethylphenidate HCl 30 MG CP24, Take 1 capsule (30 mg total) by mouth daily., Disp: 30 capsule, Rfl:  0   Dulaglutide 3 MG/0.5ML SOPN, Inject 3 mg into the skin once a week., Disp: 6 mL, Rfl: 0   glucose blood test strip, Use as instructed, Disp: 100 each, Rfl: 12   metFORMIN (GLUCOPHAGE) 1000 MG tablet, Take 1 tablet (1,000 mg total) by mouth 2 (two) times daily with a meal., Disp: 180 tablet, Rfl: 0   Microlet Lancets MISC, 1 application by Does not apply route daily as needed., Disp: 90 each, Rfl: 0   naproxen (NAPROSYN) 500 MG tablet, Take 1 tablet (500 mg total) by mouth 2 (two) times daily., Disp: 30 tablet, Rfl: 0 Medication Side Effects: none  Family Medical/ Social History: Changes? No  MENTAL HEALTH EXAM:  There were no vitals taken for this visit.There is no height or weight on file to calculate BMI.  General Appearance: Casual and Disheveled  Eye Contact:  Good  Speech:  Clear and Coherent  Volume:  Normal  Mood:  NA  Affect:  Appropriate  Thought Process:  Coherent  Orientation:  Full (Time, Place, and Person)  Thought Content: Logical   Suicidal Thoughts:  No  Homicidal Thoughts:  No  Memory:  WNL  Judgement:  Fair  Insight:  Fair  Psychomotor Activity:  Decreased  Concentration:  Concentration: Fair  Recall:  Good  Fund of Knowledge: Fair  Language: Fair  Assets:  Desire for Improvement Resilience Social Support  ADL's:  Intact  Cognition: WNL  Prognosis:  Good  DIAGNOSES:    ICD-10-CM   1. Attention deficit hyperactivity disorder (ADHD), combined type  F90.2     2. Schizoaffective disorder, bipolar type ([place])  F25.0     3. Circadian rhythm sleep disorder, shift work type  G47.26       Receiving Psychotherapy: No    RECOMMENDATIONS:   Greater than 50% of 20 min. face to face time with patient was spent on counseling and coordination of care. We discussed patients current mental status and struggles with fatigue. He says he starts to decline in the evenings when the methylphenidate starts to wear off. Says he feel foggy for his second job.  Patient did not want to make medication changes at this time. Counseled patient on making sure he calls one week prior to running out of medications to avoid relapse.   Continue Rexulti 3 mg daily To increase dexmethylphenidate to 40 mg daily Will report any side effect or worsening symptoms Greater than 50% of  30 min face to face time with patient was spent on counseling and coordination of care. We discussed his improvements with his moods. He is happy with his medications but does think he could benefit from increase of dexmethylphenidate.  We did discuss him running out of medications repeatedly. He is not managing his scripts when he gets low on medications. He runs out of meds and then experiences changes in his behavior at work. I stressed the importance of notifying this office in advance.  He is now doing daily meditation with crystals and would like to consider mindfulness and yoga. Agreed to every three month follow up while taking stimulants Discussed potential benefits, risks, and side effects of stimulants with patient to include increased heart rate, palpitations, insomnia, increased anxiety, increased irritability, or decreased appetite.  Instructed patient to contact office if experiencing any significant tolerability issues.           Elwanda Brooklyn, NP

## 2021-09-29 ENCOUNTER — Other Ambulatory Visit: Payer: Self-pay

## 2021-09-29 DIAGNOSIS — F902 Attention-deficit hyperactivity disorder, combined type: Secondary | ICD-10-CM

## 2021-09-29 MED ORDER — DEXMETHYLPHENIDATE HCL ER 30 MG PO CP24: 30.0000 mg | ORAL_CAPSULE | Freq: Every day | ORAL | 0 refills | Status: DC

## 2021-09-29 NOTE — Telephone Encounter (Signed)
Pharmacy contacted office that do no have dexmethylphenidate ER 40 mg capsules but they did have 30 mg available. Pended Rx for Dr. Jennelle Human to send while Arlys John out of office.

## 2021-10-06 ENCOUNTER — Ambulatory Visit (INDEPENDENT_AMBULATORY_CARE_PROVIDER_SITE_OTHER): Payer: BC Managed Care – PPO | Admitting: Family Medicine

## 2021-10-06 ENCOUNTER — Encounter: Payer: Self-pay | Admitting: Family Medicine

## 2021-10-06 VITALS — BP 100/67 | HR 79 | Temp 98.1°F | Resp 16 | Wt 248.4 lb

## 2021-10-06 DIAGNOSIS — E1165 Type 2 diabetes mellitus with hyperglycemia: Secondary | ICD-10-CM

## 2021-10-06 NOTE — Progress Notes (Signed)
Established Patient Office Visit  Subjective    Patient ID: Jared Potts, male    DOB: 24-Oct-1995  Age: 26 y.o. MRN: [id number]  CC:  Chief Complaint  Patient presents with   Follow-up   Diabetes    HPI Jared Potts presents for follow up of diabetes. He reports that he is doing well with present management. He denies acute complaints and concerns.    Outpatient Encounter Medications as of 10/06/2021  Medication Sig   blood glucose meter kit and supplies KIT *ONETOUCH ULTRA* Use to test blood sugar daily as needed   Blood Glucose Monitoring Suppl (CONTOUR NEXT LINK) w/Device KIT USE TO TEST BLOOD SUGAR DAILY AS NEEDED   Brexpiprazole (REXULTI) 3 MG TABS Take 1 tablet (3 mg total) by mouth daily after breakfast.   Continuous Blood Gluc Receiver (FREESTYLE LIBRE 14 DAY READER) DEVI Use to check blood sugar TID. E11.65   Continuous Blood Gluc Receiver (FREESTYLE LIBRE READER) DEVI 1 kit by Does not apply route 4 (four) times daily -  before meals and at bedtime.   Continuous Blood Gluc Sensor (FREESTYLE LIBRE 14 DAY SENSOR) MISC Use to check blood sugar TID. E11.65   Dexmethylphenidate HCl 40 MG CP24 Take 1 capsule (40 mg total) by mouth daily.   Dulaglutide 3 MG/0.5ML SOPN Inject 3 mg into the skin once a week.   glucose blood test strip Use as instructed   metFORMIN (GLUCOPHAGE) 1000 MG tablet Take 1 tablet (1,000 mg total) by mouth 2 (two) times daily with a meal.   Microlet Lancets MISC 1 application by Does not apply route daily as needed.   naproxen (NAPROSYN) 500 MG tablet Take 1 tablet (500 mg total) by mouth 2 (two) times daily.   Dexmethylphenidate HCl 30 MG CP24 Take 1 capsule (30 mg total) by mouth daily. (Patient not taking: Reported on 10/06/2021)   No facility-administered encounter medications on file as of 10/06/2021.    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    Thyroiditis, autoimmune    Type 2 diabetes mellitus in patient age 82-19 years with HbA1C goal below 7.5     Past Surgical History:  Procedure Laterality Date   CIRCUMCISION      Family History  Problem Relation Age of Onset   Obesity Mother    Diabetes Maternal Aunt    Obesity Maternal Aunt    Diabetes Maternal Grandmother    Obesity Maternal Grandmother    Hypertension Father    Cancer Neg Hx     Social History   Socioeconomic History   Marital status: Single    Spouse name: Not on file   Number of children: Not on file   Years of education: Not on file   Highest education level: Not on file  Occupational History   Not on file  Tobacco Use   Smoking status: Never    Passive exposure: Never   Smokeless tobacco: Never  Vaping Use   Vaping Use: Never used  Substance and Sexual Activity   Alcohol use: Yes    Comment: occasionally   Drug use: No   Sexual activity: Not Currently  Other Topics Concern   Not on file  Social History Narrative  Lives with mom and dad and brother. Likes to make music, hip-hop and rap. Participates in recording.   Social Determinants of Health   Financial Resource Strain: Not on file  Food Insecurity: Not on file  Transportation Needs: Not on file  Physical Activity: Not on file  Stress: Not on file  Social Connections: Not on file  Intimate Partner Violence: Not on file    Review of Systems  All other systems reviewed and are negative.      Objective    BP 100/67   Pulse 79   Temp 98.1 F (36.7 C) (Oral)   Resp 16   Wt 248 lb 6.4 oz (112.7 kg)   SpO2 97%   BMI 34.64 kg/m   Physical Exam Vitals and nursing note reviewed.  Constitutional:      General: He is not in acute distress. Cardiovascular:     Rate and Rhythm: Normal rate and regular rhythm.  Pulmonary:     Effort:  Pulmonary effort is normal.     Breath sounds: Normal breath sounds.  Neurological:     General: No focal deficit present.     Mental Status: He is alert and oriented to person, place, and time.        Assessment & Plan:   1. Type 2 diabetes mellitus with hyperglycemia, without long-term current use of insulin [facility]) Patient doing well with present management. Continue and monitor    Return in about 2 months (around 12/12/2021).   Becky Sax, MD

## 2021-10-19 ENCOUNTER — Ambulatory Visit: Payer: BC Managed Care – PPO | Admitting: Family Medicine

## 2021-11-06 ENCOUNTER — Ambulatory Visit: Payer: BC Managed Care – PPO | Admitting: Skilled Nursing Facility1

## 2021-12-13 ENCOUNTER — Other Ambulatory Visit: Payer: Self-pay

## 2021-12-13 ENCOUNTER — Telehealth: Payer: Self-pay | Admitting: Behavioral Health

## 2021-12-13 DIAGNOSIS — F902 Attention-deficit hyperactivity disorder, combined type: Secondary | ICD-10-CM

## 2021-12-13 MED ORDER — DEXMETHYLPHENIDATE HCL ER 40 MG PO CP24: 40.0000 mg | ORAL_CAPSULE | Freq: Every day | ORAL | 0 refills | Status: DC

## 2021-12-13 NOTE — Telephone Encounter (Signed)
Patient called for refill on Dexmethlphenidate 40mg . Appt 8/11. Ph: [phone] Pharmacy [address] 10/11

## 2021-12-13 NOTE — Telephone Encounter (Signed)
Pended.

## 2021-12-15 ENCOUNTER — Ambulatory Visit: Payer: BC Managed Care – PPO | Admitting: Family Medicine

## 2021-12-15 ENCOUNTER — Ambulatory Visit (INDEPENDENT_AMBULATORY_CARE_PROVIDER_SITE_OTHER): Payer: Self-pay | Admitting: Behavioral Health

## 2021-12-15 ENCOUNTER — Other Ambulatory Visit: Payer: Self-pay | Admitting: Family

## 2021-12-15 DIAGNOSIS — E1165 Type 2 diabetes mellitus with hyperglycemia: Secondary | ICD-10-CM

## 2021-12-15 DIAGNOSIS — F489 Nonpsychotic mental disorder, unspecified: Secondary | ICD-10-CM

## 2021-12-15 NOTE — Progress Notes (Signed)
Patient did not show for scheduled appointment and did not provide 24-hour notice as required by policy.  Fees will be assessed. 

## 2021-12-15 NOTE — Telephone Encounter (Signed)
Requested medication (s) are due for refill today:   Yes  Requested medication (s) are on the active medication list:   Yes  Future visit scheduled:   No   Seen 2 mo. ago   Last ordered: 09/11/2021 #180, 0 refills  Returned because labs are expired.      Requested Prescriptions  Pending Prescriptions Disp Refills   metFORMIN (GLUCOPHAGE) 1000 MG tablet [Pharmacy Med Name: METFORMIN HCL 1,000 MG TABLET] 180 tablet 0    Sig: TAKE 1 TABLET (1,000 MG TOTAL) BY MOUTH TWICE A DAY WITH FOOD     Endocrinology:  Diabetes - Biguanides Failed - 12/15/2021  2:37 AM      Failed - Cr in normal range and within 360 days    Creat  Date Value Ref Range Status  04/12/2017 0.85 0.60 - 1.35 mg/dL Final   Creatinine, Ser  Date Value Ref Range Status  11/26/2019 1.18 0.76 - 1.27 mg/dL Final   Creatinine,U  Date Value Ref Range Status  04/12/2017 85.5 mg/dL Final   Creatinine, Urine  Date Value Ref Range Status  04/27/2016 53 20 - 370 mg/dL Final         Failed - HBA1C is between 0 and 7.9 and within 180 days    Hemoglobin A1C  Date Value Ref Range Status  09/11/2021 11.6 (A) 4.0 - 5.6 % Final   Hgb A1c MFr Bld  Date Value Ref Range Status  09/16/2017 6.6 (H) 4.6 - 6.5 % Final    Comment:    Glycemic Control Guidelines for People with Diabetes:Non Diabetic:  <6%Goal of Therapy: <7%Additional Action Suggested:  >8%          Failed - eGFR in normal range and within 360 days    GFR, Est African American  Date Value Ref Range Status  04/12/2017 143 > OR = 60 mL/min/1.59m Final   GFR calc Af Amer  Date Value Ref Range Status  11/26/2019 99 >59 mL/min/1.73 Final    Comment:    **[facility] currently reports eGFR in compliance with the current**   recommendations of the [employer] [facility] will   update reporting as new guidelines are published from the NKF-ASN   Task force.    GFR, Est Non African American  Date Value Ref Range Status  04/12/2017 124 > OR = 60  mL/min/1.781mFinal   GFR calc non Af Amer  Date Value Ref Range Status  11/26/2019 86 >59 mL/min/1.73 Final   GFR  Date Value Ref Range Status  09/16/2017 154.47 >60.00 mL/min Final         Failed - B12 Level in normal range and within 720 days    Vitamin B-12  Date Value Ref Range Status  04/14/2015 433 211 - 911 pg/mL Final         Failed - CBC within normal limits and completed in the last 12 months    WBC  Date Value Ref Range Status  05/22/2009 10.4 4.5 - 13.5 K/uL Final   RBC  Date Value Ref Range Status  05/22/2009 4.68 3.80 - 5.20 MIL/uL Final   Hemoglobin  Date Value Ref Range Status  05/22/2009 13.5 11.0 - 14.6 g/dL Final   HCT  Date Value Ref Range Status  05/22/2009 39.6 33.0 - 44.0 % Final   MCHC  Date Value Ref Range Status  05/22/2009 34.1 31.0 - 37.0 g/dL Final   No results found for: "MCH", "MCJoshuaMCV  Date Value Ref Range Status  05/22/2009 84.6 77.0 - 95.0 fL Final   No results found for: "PLTCOUNTKUC", "LABPLAT", "POCPLA" RDW  Date Value Ref Range Status  05/22/2009 13.6 11.3 - 15.5 % Final         Passed - Valid encounter within last 6 months    Recent Outpatient Visits           2 months ago Type 2 diabetes mellitus with hyperglycemia, without long-term current use of insulin [facility])   [facility] at [facility], Clyde Canterbury, MD   3 months ago Type 2 diabetes mellitus with hyperglycemia, without long-term current use of insulin [facility])   Primary [facility] at [facility], Amy J, NP   1 year ago Type 2 diabetes mellitus with hyperglycemia, without long-term current use of insulin [facility])   [place], Jarome Matin, RPH-CPP   1 year ago Type 2 diabetes mellitus with hyperglycemia, without long-term current use of insulin [facility])   [place], Jarome Matin, RPH-CPP   1 year ago Type 2 diabetes mellitus with hyperglycemia, without long-term current use  of insulin [facility])   Primary Care at [facility], Bayard Beaver, MD

## 2021-12-19 ENCOUNTER — Ambulatory Visit (INDEPENDENT_AMBULATORY_CARE_PROVIDER_SITE_OTHER): Payer: Self-pay | Admitting: Behavioral Health

## 2021-12-19 DIAGNOSIS — F489 Nonpsychotic mental disorder, unspecified: Secondary | ICD-10-CM

## 2021-12-19 NOTE — Progress Notes (Signed)
Patient did not show again for scheduled appointment which was a rescheduled appointment.  He again did not provide 24-hour notice as required.  A conversation needs to be had with patient about his habitual lateness or no-show.  Fees to be assessed.

## 2021-12-22 ENCOUNTER — Encounter: Payer: Self-pay | Admitting: Behavioral Health

## 2021-12-22 ENCOUNTER — Ambulatory Visit: Payer: BC Managed Care – PPO | Admitting: Behavioral Health

## 2021-12-22 DIAGNOSIS — F902 Attention-deficit hyperactivity disorder, combined type: Secondary | ICD-10-CM | POA: Diagnosis not present

## 2021-12-22 MED ORDER — REXULTI 3 MG PO TABS: 3.0000 mg | ORAL_TABLET | Freq: Every day | ORAL | 5 refills | Status: DC

## 2021-12-22 NOTE — Progress Notes (Signed)
[facility]  Patient ID: Jared Potts,  MRN: [id number]  PCP: Dorna Mai, MD  Date of Evaluation: 12/22/2021 Time spent:30 minutes  Chief Complaint:   HISTORY/CURRENT STATUS: HPI  26 year old male presents to this office for follow up and medication management. He says that he has been doing well for the most part but still is having problems managing medications and calling when he is running low on his script. He is concerned about his mental fogginess when his dexmethylphenidate starts to wain in the afternoon.  He is still working night shift [employer] and works second part time job at [employer].  He reports no anxiety or depression this visit. He sleeps 7 plus hours per night. No mania, no psychosis, No SI/HI.   Pt did not know his past psychiatric medication trials       Individual Medical History/ Review of Systems: Changes? :No   Allergies: Citrus  Current Medications:  Current Outpatient Medications:    blood glucose meter kit and supplies KIT, *ONETOUCH ULTRA* Use to test blood sugar daily as needed, Disp: 1 each, Rfl: 0   Blood Glucose Monitoring Suppl (CONTOUR NEXT LINK) w/Device KIT, USE TO TEST BLOOD SUGAR DAILY AS NEEDED, Disp: 1 kit, Rfl: 0   Brexpiprazole (REXULTI) 3 MG TABS, Take 1 tablet (3 mg total) by mouth daily after breakfast., Disp: 30 tablet, Rfl: 5   Continuous Blood Gluc Receiver (FREESTYLE LIBRE 14 DAY READER) DEVI, Use to check blood sugar TID. E11.65, Disp: 1 each, Rfl: 5   Continuous Blood Gluc Receiver (FREESTYLE LIBRE READER) DEVI, 1 kit by Does not apply route 4 (four) times daily -  before meals and at bedtime., Disp: 1 each, Rfl: 0   Continuous Blood Gluc Sensor (FREESTYLE LIBRE 14 DAY SENSOR) MISC, Use to check blood sugar TID. E11.65, Disp: 1 each, Rfl: 5   Dexmethylphenidate HCl 30 MG CP24, Take 1 capsule (30 mg total) by mouth daily. (Patient not taking: Reported on 10/06/2021), Disp: 30 capsule, Rfl: 0   Dexmethylphenidate HCl  40 MG CP24, Take 1 capsule (40 mg total) by mouth daily., Disp: 30 capsule, Rfl: 0   glucose blood test strip, Use as instructed, Disp: 100 each, Rfl: 12   metFORMIN (GLUCOPHAGE) 1000 MG tablet, TAKE 1 TABLET (1,000 MG TOTAL) BY MOUTH TWICE A DAY WITH FOOD, Disp: 180 tablet, Rfl: 0   Microlet Lancets MISC, 1 application by Does not apply route daily as needed., Disp: 90 each, Rfl: 0   naproxen (NAPROSYN) 500 MG tablet, Take 1 tablet (500 mg total) by mouth 2 (two) times daily., Disp: 30 tablet, Rfl: 0 Medication Side Effects: none  Family Medical/ Social History: Changes? No  MENTAL HEALTH EXAM:  There were no vitals taken for this visit.There is no height or weight on file to calculate BMI.  General Appearance: Casual, Neat, and Well Groomed  Eye Contact:  Good  Speech:  Clear and Coherent  Volume:  Normal  Mood:  Anxious  Affect:  Appropriate  Thought Process:  Coherent  Orientation:  Full (Time, Place, and Person)  Thought Content: Logical   Suicidal Thoughts:  No  Homicidal Thoughts:  No  Memory:  WNL  Judgement:  Fair  Insight:  Fair  Psychomotor Activity:  Normal  Concentration:  Concentration: Good  Recall:  Good  Fund of Knowledge: Good  Language: Good  Assets:  Desire for Improvement  ADL's:  Intact  Cognition: WNL  Prognosis:  Good  DIAGNOSES: No diagnosis found.  Receiving Psychotherapy: No    RECOMMENDATIONS:    Greater than 50% of 20 min. face to face time with patient was spent on counseling and coordination of care. We discussed patients current mental status and struggles with fatigue. He says he starts to decline in the evenings when the methylphenidate starts to wear off. Says he feel foggy for his second job.  I  encouraged him to pick his medications up on time because he has some decline when he stops taking. Patient did not want to make medication changes at this time. Counseled patient on making sure he calls one week prior to running out of  medications to avoid relapse.   Continue Rexulti 3 mg daily Continue dexmethylphenidate to 40 mg daily Will report any side effect or worsening symptoms Greater than 50% of  30 min face to face time with patient was spent on counseling and coordination of care. We discussed his improvements with his moods. He is happy with his medications but does think he could benefit from increase of dexmethylphenidate.  We did discuss him running out of medications repeatedly. He is not managing his scripts when he gets low on medications. He runs out of meds and then experiences changes in his behavior at work. I stressed the importance of notifying this office in advance.  He is now doing daily meditation with crystals and would like to consider mindfulness and yoga. Agreed to every three month follow up while taking stimulants Discussed potential benefits, risks, and side effects of stimulants with patient to include increased heart rate, palpitations, insomnia, increased anxiety, increased irritability, or decreased appetite.  Instructed patient to contact office if experiencing any significant tolerability issues.      Elwanda Brooklyn, NP

## 2022-01-07 ENCOUNTER — Ambulatory Visit (HOSPITAL_COMMUNITY)
Admission: EM | Admit: 2022-01-07 | Discharge: 2022-01-07 | Disposition: A | Discharge status: Home / Self Care | Payer: BC Managed Care – PPO

## 2022-01-07 ENCOUNTER — Encounter (HOSPITAL_COMMUNITY): Payer: Self-pay

## 2022-01-07 DIAGNOSIS — S161XXA Strain of muscle, fascia and tendon at neck level, initial encounter: Secondary | ICD-10-CM | POA: Diagnosis not present

## 2022-01-07 MED ORDER — CYCLOBENZAPRINE HCL 10 MG PO TABS: 10.0000 mg | ORAL_TABLET | Freq: Every day | ORAL | 0 refills | Status: AC

## 2022-01-07 MED ORDER — NAPROXEN SODIUM 550 MG PO TABS: 550.0000 mg | ORAL_TABLET | Freq: Two times a day (BID) | ORAL | 0 refills | Status: AC
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
## 2022-01-07 NOTE — Discharge Instructions (Signed)
Your pain is most likely caused by irritation to the muscles.  Take naproxen every morning and every evening for 5 days to reduce the inflammatory response that occurs with injury without will help with your pain, may use Tylenol in addition to this  May use muscle relaxer at bedtime as needed for additional comfort, be mindful this medication may make you drowsy  You may use heating pad in 15 minute intervals as needed for additional comfort, within the first 2-3 days you may find comfort in using ice in 10-15 minutes over affected area  Begin stretching affected area daily for 10 minutes as tolerated to further loosen muscles support  Can try sleeping without pillow on firm mattress   Practice good posture: head back, shoulders back, chest forward, pelvis back and weight distributed evenly on both legs  If pain persist after recommended treatment or reoccurs if may be beneficial to follow up with orthopedic specialist for evaluation, this doctor specializes in the bones and can manage your symptoms long-term with options such as but not limited to imaging, medications or physical therapy
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
## 2022-02-12 ENCOUNTER — Ambulatory Visit
Admission: EM | Admit: 2022-02-12 | Discharge: 2022-02-12 | Disposition: A | Discharge status: Home / Self Care | Payer: BC Managed Care – PPO | Attending: Physician Assistant | Admitting: Physician Assistant

## 2022-02-12 ENCOUNTER — Ambulatory Visit (INDEPENDENT_AMBULATORY_CARE_PROVIDER_SITE_OTHER): Payer: BC Managed Care – PPO

## 2022-02-12 DIAGNOSIS — R0782 Intercostal pain: Secondary | ICD-10-CM | POA: Diagnosis not present

## 2022-02-12 DIAGNOSIS — S299XXA Unspecified injury of thorax, initial encounter: Secondary | ICD-10-CM | POA: Diagnosis not present

## 2022-02-12 NOTE — ED Provider Notes (Signed)
[facility]    CSN: [id number] Arrival date & time: 02/12/22  1643      History   Chief Complaint Chief Complaint  Patient presents with   wrestling injury    HPI Jared Potts is a 26 y.o. male.   Patient here today for evaluation of injury that occurred while wrestling last week.  He is concerned that he might have a rib fracture because he has had popping in the area of his anterior right lower ribs.  He denies any shortness of breath.  He denies any significant pain associated with popping sensation but does state that it is uncomfortable.  He has not taken any medication for symptoms.  The history is provided by the patient.    Past Medical History:  Diagnosis Date   Attention deficit disorder    Combined hyperlipidemia    Goiter    Gynecomastia, male    Hypertension    Noncompliance with treatment    Obesity, Class III, BMI 40-49.9 (morbid obesity) ([place])    Schizophrenia in children ([place])    Thyroiditis, autoimmune    Type 2 diabetes mellitus in patient age 49-19 years with HbA1C goal below 7.5     Patient Active Problem List   Diagnosis Date Noted   Circadian rhythm sleep disorder, shift work type 03/09/2018   Type 2 diabetes mellitus with hyperglycemia, without long-term current use of insulin ([place]) 02/07/2017   Schizoaffective disorder ([place])    Gynecomastia, male    Combined hyperlipidemia    Attention deficit hyperactivity disorder (ADHD), combined type, moderate    Obesity 09/28/2010    Past Surgical History:  Procedure Laterality Date   CIRCUMCISION         Home Medications    Prior to Admission medications   Medication Sig Start Date End Date Taking? Authorizing Provider  blood glucose meter kit and supplies KIT *ONETOUCH ULTRA* Use to test blood sugar daily as needed 07/07/20   Nicolette Bang, MD  Blood Glucose Monitoring Suppl (CONTOUR NEXT LINK) w/Device KIT USE TO TEST BLOOD SUGAR DAILY AS NEEDED 06/15/20   Nicolette Bang, MD  Brexpiprazole (REXULTI) 3 MG TABS Take 1 tablet (3 mg total) by mouth daily after breakfast. 12/22/21   Elwanda Brooklyn, NP  Continuous Blood Gluc Receiver (FREESTYLE LIBRE 14 DAY READER) DEVI Use to check blood sugar TID. E11.65 09/11/21   Camillia Herter, NP  Continuous Blood Gluc Receiver (FREESTYLE LIBRE READER) DEVI 1 kit by Does not apply route 4 (four) times daily -  before meals and at bedtime. 09/11/21   Camillia Herter, NP  Continuous Blood Gluc Sensor (FREESTYLE LIBRE 14 DAY SENSOR) MISC Use to check blood sugar TID. E11.65 09/11/21   Camillia Herter, NP  cyclobenzaprine (FLEXERIL) 10 MG tablet Take 1 tablet (10 mg total) by mouth at bedtime. 01/07/22   White, Leitha Schuller, NP  Dexmethylphenidate HCl 30 MG CP24 Take 1 capsule (30 mg total) by mouth daily. Patient not taking: Reported on 10/06/2021 09/29/21 10/29/21  Purnell Shoemaker., MD  Dexmethylphenidate HCl 40 MG CP24 Take 1 capsule (40 mg total) by mouth daily. 12/13/21 01/12/22  Elwanda Brooklyn, NP  Dulaglutide (TRULICITY) 3 WG/9.5AO SOPN as directed Subcutaneous    [provider]  glucose blood test strip Use as instructed 07/23/20   Nicolette Bang, MD  metFORMIN (GLUCOPHAGE) 1000 MG tablet TAKE 1 TABLET (1,000 MG TOTAL) BY MOUTH TWICE A DAY WITH FOOD 12/19/21  Camillia Herter, NP  Microlet Lancets MISC 1 application by Does not apply route daily as needed. 06/10/20   Nicolette Bang, MD  naproxen (NAPROSYN) 500 MG tablet Take 1 tablet (500 mg total) by mouth 2 (two) times daily. 10/19/20   Wieters, Hallie C, PA-C  naproxen (NAPROSYN) 500 MG tablet Take 1 tablet by mouth 2 (two) times daily as needed.    [provider]  naproxen sodium (ANAPROX DS) 550 MG tablet Take 1 tablet (550 mg total) by mouth 2 (two) times daily with a meal. 01/07/22   White, Leitha Schuller, NP    Family History Family History  Problem Relation Age of Onset   Obesity Mother    Diabetes Maternal Aunt    Obesity Maternal  Aunt    Diabetes Maternal Grandmother    Obesity Maternal Grandmother    Hypertension Father    Cancer Neg Hx     Social History Social History   Tobacco Use   Smoking status: Never    Passive exposure: Never   Smokeless tobacco: Never  Vaping Use   Vaping Use: Never used  Substance Use Topics   Alcohol use: Yes    Comment: occasionally   Drug use: No     Allergies   Citrus   Review of Systems Review of Systems  Constitutional:  Negative for chills and fever.  Eyes:  Negative for discharge and redness.  Respiratory:  Negative for shortness of breath.   Cardiovascular:  Positive for chest pain.     Physical Exam Triage Vital Signs ED Triage Vitals  Enc Vitals Group     BP 02/12/22 1750 124/73     Pulse Rate 02/12/22 1750 74     Resp 02/12/22 1750 16     Temp 02/12/22 1750 98 F (36.7 C)     Temp Source 02/12/22 1750 Oral     SpO2 02/12/22 1750 98 %     Weight --      Height --      Head Circumference --      Peak Flow --      Pain Score 02/12/22 1751 0     Pain Loc --      Pain Edu? --      Excl. in [place]? --    No data found.  Updated Vital Signs BP 124/73 (BP Location: Left Arm)   Pulse 74   Temp 98 F (36.7 C) (Oral)   Resp 16   SpO2 98%     Physical Exam Vitals and nursing note reviewed.  Constitutional:      General: He is not in acute distress.    Appearance: Normal appearance. He is not ill-appearing.  HENT:     Head: Normocephalic and atraumatic.  Eyes:     Conjunctiva/sclera: Conjunctivae normal.  Cardiovascular:     Rate and Rhythm: Normal rate.  Pulmonary:     Effort: Pulmonary effort is normal. No respiratory distress.  Chest:     Chest wall: No tenderness.  Neurological:     Mental Status: He is alert.  Psychiatric:        Mood and Affect: Mood normal.        Behavior: Behavior normal.        Thought Content: Thought content normal.      UC Treatments / Results  Labs (all labs ordered are listed, but only abnormal  results are displayed) Labs Reviewed - No data to display  EKG   Radiology  DG Ribs Unilateral W/Chest Right  Result Date: 02/12/2022 CLINICAL DATA:  injury EXAM: RIGHT RIBS AND CHEST - 3+ VIEW COMPARISON:  January 27, 2008 FINDINGS: No acute displaced rib fracture. There is no evidence of pneumothorax or pleural effusion. Both lungs are clear. Heart size and mediastinal contours are within normal limits. IMPRESSION: Negative. Electronically Signed   By: Valentino Saxon M.D.   On: 02/12/2022 18:20    Procedures Procedures (including critical care time)  Medications Ordered in UC Medications - No data to display  Initial Impression / Assessment and Plan / UC Course  I have reviewed the triage vital signs and the nursing notes.  Pertinent labs & imaging results that were available during my care of the patient were reviewed by me and considered in my medical decision making (see chart for details).    Reassured patient no rib fracture on x-ray.  Discussed possible muscular strain and recommended ibuprofen if needed.  Recommend follow-up if no gradual improvement or with any further concerns.  Final Clinical Impressions(s) / UC Diagnoses   Final diagnoses:  Rib injury   Discharge Instructions   None    ED Prescriptions   None    PDMP not reviewed this encounter.   Francene Finders, PA-C 02/12/22 1913

## 2022-02-12 NOTE — ED Triage Notes (Signed)
Pt c/o wrestling injury sometimes last week and concerned for a right rib fracture. Denies pain but states with certain position he feels a "pop"

## 2022-02-13 ENCOUNTER — Ambulatory Visit: Admission: EM | Admit: 2022-02-13 | Discharge: 2022-02-13 | Payer: BC Managed Care – PPO

## 2022-02-13 DIAGNOSIS — R0789 Other chest pain: Secondary | ICD-10-CM

## 2022-02-13 NOTE — ED Notes (Signed)
Patient is being discharged from the Urgent Care and sent to the Emergency Department via personal vehicle . Per Provider Ewell Poe, patient is in need of higher level of care due to chest pain. Patient is aware and verbalizes understanding of plan of care.   Vitals:   02/13/22 1439  BP: 117/79  Pulse: 75  Resp: 18  Temp: (!) 97.3 F (36.3 C)  SpO2: 98%

## 2022-02-13 NOTE — ED Provider Notes (Signed)
Patient returns today because he is not confident in information/ diagnosis provided yesterday. Discussed we could not provide any further information at [facility] as we only have xray capability and that was negative yesterday. Advised evaluation at [facility] if he is concerned and would like further imaging, etc.    Francene Finders, PA-C 02/13/22 1450
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
## 2022-02-13 NOTE — Discharge Instructions (Signed)
  Please report to [facility] for further evaluation.  

## 2022-02-15 ENCOUNTER — Encounter: Payer: Self-pay | Admitting: Family Medicine

## 2022-02-15 ENCOUNTER — Ambulatory Visit: Payer: BC Managed Care – PPO | Admitting: Family Medicine

## 2022-02-15 VITALS — BP 117/72 | HR 81 | Temp 97.7°F | Resp 16 | Wt 261.8 lb

## 2022-02-15 DIAGNOSIS — E1165 Type 2 diabetes mellitus with hyperglycemia: Secondary | ICD-10-CM

## 2022-02-15 DIAGNOSIS — R0789 Other chest pain: Secondary | ICD-10-CM

## 2022-02-15 DIAGNOSIS — F411 Generalized anxiety disorder: Secondary | ICD-10-CM

## 2022-02-15 LAB — POCT GLYCOSYLATED HEMOGLOBIN (HGB A1C): Hemoglobin A1C: 6.1 % — AB (ref 4.0–5.6)

## 2022-02-15 MED ORDER — FREESTYLE LIBRE 2 SENSOR MISC: 1 refills | Status: DC

## 2022-02-15 MED ORDER — TRULICITY 3 MG/0.5ML ~~LOC~~ SOAJ: SUBCUTANEOUS | 1 refills | Status: DC

## 2022-02-15 MED ORDER — METFORMIN HCL 1000 MG PO TABS: ORAL_TABLET | ORAL | 1 refills | Status: DC

## 2022-02-19 ENCOUNTER — Encounter: Payer: Self-pay | Admitting: Family Medicine

## 2022-02-19 NOTE — Progress Notes (Signed)
Established Patient Office Visit  Subjective    Patient ID: Jared Potts, male    DOB: 06/11/1995  Age: 26 y.o. MRN: [id number]  CC:  Chief Complaint  Patient presents with   Follow-up   Diabetes    HPI Jared Potts presents for routine follow up of depression. Patient also reports some intermittent chest pain but has not had SOB. He reports that he was "wrestling" and believes that he pulled a muscle. Patient was seen and evaluated at [facility] for sx.    Outpatient Encounter Medications as of 02/15/2022  Medication Sig   Continuous Blood Gluc Sensor (FREESTYLE LIBRE 2 SENSOR) MISC Utilize to monitor glucose as recommended/prn   blood glucose meter kit and supplies KIT *ONETOUCH ULTRA* Use to test blood sugar daily as needed   Blood Glucose Monitoring Suppl (CONTOUR NEXT LINK) w/Device KIT USE TO TEST BLOOD SUGAR DAILY AS NEEDED   Brexpiprazole (REXULTI) 3 MG TABS Take 1 tablet (3 mg total) by mouth daily after breakfast.   Continuous Blood Gluc Receiver (FREESTYLE LIBRE 14 DAY READER) DEVI Use to check blood sugar TID. E11.65   Continuous Blood Gluc Receiver (FREESTYLE LIBRE READER) DEVI 1 kit by Does not apply route 4 (four) times daily -  before meals and at bedtime.   Continuous Blood Gluc Sensor (FREESTYLE LIBRE 14 DAY SENSOR) MISC Use to check blood sugar TID. E11.65   cyclobenzaprine (FLEXERIL) 10 MG tablet Take 1 tablet (10 mg total) by mouth at bedtime.   Dexmethylphenidate HCl 30 MG CP24 Take 1 capsule (30 mg total) by mouth daily. (Patient not taking: Reported on 10/06/2021)   Dexmethylphenidate HCl 40 MG CP24 Take 1 capsule (40 mg total) by mouth daily.   Dulaglutide (TRULICITY) 3 GE/9.5MW SOPN as directed Subcutaneous   glucose blood test strip Use as instructed   metFORMIN (GLUCOPHAGE) 1000 MG tablet TAKE 1 TABLET (1,000 MG TOTAL) BY MOUTH TWICE A DAY WITH FOOD   Microlet Lancets MISC 1 application by Does not apply route daily as needed.   naproxen (NAPROSYN) 500 MG  tablet Take 1 tablet (500 mg total) by mouth 2 (two) times daily.   naproxen (NAPROSYN) 500 MG tablet Take 1 tablet by mouth 2 (two) times daily as needed.   naproxen sodium (ANAPROX DS) 550 MG tablet Take 1 tablet (550 mg total) by mouth 2 (two) times daily with a meal.   [DISCONTINUED] Dulaglutide (TRULICITY) 3 UX/3.2GM SOPN as directed Subcutaneous   [DISCONTINUED] metFORMIN (GLUCOPHAGE) 1000 MG tablet TAKE 1 TABLET (1,000 MG TOTAL) BY MOUTH TWICE A DAY WITH FOOD   No facility-administered encounter medications on file as of 02/15/2022.    Past Medical History:  Diagnosis Date   Attention deficit disorder    Combined hyperlipidemia    Goiter    Gynecomastia, male    Hypertension    Noncompliance with treatment    Obesity, Class III, BMI 40-49.9 (morbid obesity) (HCC)    Schizophrenia in children ([place])    Thyroiditis, autoimmune    Type 2 diabetes mellitus in patient age 31-19 years with HbA1C goal below 7.5     Past Surgical History:  Procedure Laterality Date   CIRCUMCISION      Family History  Problem Relation Age of Onset   Obesity Mother    Diabetes Maternal Aunt    Obesity Maternal Aunt    Diabetes Maternal Grandmother    Obesity Maternal Grandmother    Hypertension Father    Cancer Neg Hx  Social History   Socioeconomic History   Marital status: Single    Spouse name: Not on file   Number of children: Not on file   Years of education: Not on file   Highest education level: Not on file  Occupational History   Not on file  Tobacco Use   Smoking status: Never    Passive exposure: Never   Smokeless tobacco: Never  Vaping Use   Vaping Use: Never used  Substance and Sexual Activity   Alcohol use: Yes    Comment: occasionally   Drug use: No   Sexual activity: Not Currently  Other Topics Concern   Not on file  Social History Narrative   Lives with mom and dad and brother. Likes to make music, hip-hop and rap. Participates in recording.   Social  Determinants of Health   Financial Resource Strain: Not on file  Food Insecurity: Not on file  Transportation Needs: Not on file  Physical Activity: Not on file  Stress: Not on file  Social Connections: Not on file  Intimate Partner Violence: Not on file    Review of Systems  All other systems reviewed and are negative.       Objective    BP 117/72   Pulse 81   Temp 97.7 F (36.5 C) (Oral)   Resp 16   Wt 261 lb 12.8 oz (118.8 kg)   SpO2 97%   BMI 31.87 kg/m   Physical Exam Vitals and nursing note reviewed.  Constitutional:      General: He is not in acute distress. Cardiovascular:     Rate and Rhythm: Normal rate and regular rhythm.  Pulmonary:     Effort: Pulmonary effort is normal.     Breath sounds: Normal breath sounds.  Chest:     Chest wall: Tenderness present. No deformity.  Abdominal:     Palpations: Abdomen is soft.     Tenderness: There is no abdominal tenderness.  Neurological:     General: No focal deficit present.     Mental Status: He is alert and oriented to person, place, and time.  Psychiatric:        Mood and Affect: Affect normal. Mood is anxious.         Assessment & Plan:   1. Type 2 diabetes mellitus with hyperglycemia, without long-term current use of insulin (HCC) Much improved A1c and now at goal. Continue and monitor - POCT glycosylated hemoglobin (Hb A1C) - metFORMIN (GLUCOPHAGE) 1000 MG tablet; TAKE 1 TABLET (1,000 MG TOTAL) BY MOUTH TWICE A DAY WITH FOOD  Dispense: 180 tablet; Refill: 1  2. Chest wall pain Slowly improving. Continue tylenol/nsaids/topicals prn  3. Anxiety state     Return in about 6 months (around 08/17/2022) for follow up.   Becky Sax, MD

## 2022-03-16 ENCOUNTER — Other Ambulatory Visit: Payer: Self-pay

## 2022-03-16 ENCOUNTER — Telehealth: Payer: Self-pay | Admitting: Behavioral Health

## 2022-03-16 DIAGNOSIS — F902 Attention-deficit hyperactivity disorder, combined type: Secondary | ICD-10-CM

## 2022-03-16 MED ORDER — DEXMETHYLPHENIDATE HCL ER 40 MG PO CP24: 40.0000 mg | ORAL_CAPSULE | Freq: Every day | ORAL | 0 refills | Status: DC

## 2022-03-16 NOTE — Telephone Encounter (Signed)
Jared Potts called at 4:58 11/10 to request refill of his Focalin.  Appt 11/17.  [facility] [address], [place]

## 2022-03-16 NOTE — Telephone Encounter (Signed)
Pended.

## 2022-03-23 ENCOUNTER — Ambulatory Visit: Payer: BC Managed Care – PPO | Admitting: Behavioral Health

## 2022-04-06 ENCOUNTER — Ambulatory Visit (INDEPENDENT_AMBULATORY_CARE_PROVIDER_SITE_OTHER): Payer: Self-pay | Admitting: Behavioral Health

## 2022-04-06 DIAGNOSIS — F489 Nonpsychotic mental disorder, unspecified: Secondary | ICD-10-CM

## 2022-04-06 NOTE — Progress Notes (Signed)
Patient did not show for scheduled appointment and did not provide 24 hour notice as required for cancellation. Additional fee to be assessed.

## 2022-04-18 ENCOUNTER — Ambulatory Visit (INDEPENDENT_AMBULATORY_CARE_PROVIDER_SITE_OTHER): Payer: BC Managed Care – PPO | Admitting: Behavioral Health

## 2022-04-18 ENCOUNTER — Encounter: Payer: Self-pay | Admitting: Behavioral Health

## 2022-04-18 DIAGNOSIS — F902 Attention-deficit hyperactivity disorder, combined type: Secondary | ICD-10-CM | POA: Diagnosis not present

## 2022-04-18 MED ORDER — REXULTI 3 MG PO TABS: 3.0000 mg | ORAL_TABLET | Freq: Every day | ORAL | 5 refills | Status: DC

## 2022-04-18 MED ORDER — DEXMETHYLPHENIDATE HCL ER 40 MG PO CP24: 40.0000 mg | ORAL_CAPSULE | Freq: Every day | ORAL | 0 refills | Status: DC

## 2022-04-18 NOTE — Progress Notes (Signed)
[facility]  Patient ID: Jared Potts,  MRN: [id number]  PCP: Dorna Mai, MD  Date of Evaluation: 04/18/2022 Time spent:30 minutes  Chief Complaint:  Chief Complaint   Follow-up; Medication Refill; Patient Education     HISTORY/CURRENT STATUS: HPI 26 year old male presents to this office for follow up and medication management. He says that he has been doing well for the most part but still is having problems managing medications and calling when he is running low on his script.  He is happy with Rexulti and says, "that is my main medication I like". Says that he has been skipping breakfast some and notices he feels foggy. He is diabetic. He says he will be follow up with his PCP and is due for lab work next week.  He reports no anxiety or depression this visit. He sleeps 7 plus hours per night. No mania, no psychosis, No SI/HI.   Pt did not know his past psychiatric medication trials   Individual Medical History/ Review of Systems: Changes? :No   Allergies: Citrus  Current Medications:  Current Outpatient Medications:    blood glucose meter kit and supplies KIT, *ONETOUCH ULTRA* Use to test blood sugar daily as needed, Disp: 1 each, Rfl: 0   Blood Glucose Monitoring Suppl (CONTOUR NEXT LINK) w/Device KIT, USE TO TEST BLOOD SUGAR DAILY AS NEEDED, Disp: 1 kit, Rfl: 0   Brexpiprazole (REXULTI) 3 MG TABS, Take 1 tablet (3 mg total) by mouth daily after breakfast., Disp: 30 tablet, Rfl: 5   Continuous Blood Gluc Receiver (FREESTYLE LIBRE 14 DAY READER) DEVI, Use to check blood sugar TID. E11.65, Disp: 1 each, Rfl: 5   Continuous Blood Gluc Receiver (FREESTYLE LIBRE READER) DEVI, 1 kit by Does not apply route 4 (four) times daily -  before meals and at bedtime., Disp: 1 each, Rfl: 0   Continuous Blood Gluc Sensor (FREESTYLE LIBRE 14 DAY SENSOR) MISC, Use to check blood sugar TID. E11.65, Disp: 1 each, Rfl: 5   Continuous Blood Gluc Sensor (FREESTYLE LIBRE 2 SENSOR)  MISC, Utilize to monitor glucose as recommended/prn, Disp: 6 each, Rfl: 1   cyclobenzaprine (FLEXERIL) 10 MG tablet, Take 1 tablet (10 mg total) by mouth at bedtime., Disp: 10 tablet, Rfl: 0   Dexmethylphenidate HCl 30 MG CP24, Take 1 capsule (30 mg total) by mouth daily. (Patient not taking: Reported on 10/06/2021), Disp: 30 capsule, Rfl: 0   Dexmethylphenidate HCl 40 MG CP24, Take 1 capsule (40 mg total) by mouth daily., Disp: 30 capsule, Rfl: 0   Dulaglutide (TRULICITY) 3 NF/6.2ZH SOPN, as directed Subcutaneous, Disp: 6 mL, Rfl: 1   glucose blood test strip, Use as instructed, Disp: 100 each, Rfl: 12   metFORMIN (GLUCOPHAGE) 1000 MG tablet, TAKE 1 TABLET (1,000 MG TOTAL) BY MOUTH TWICE A DAY WITH FOOD, Disp: 180 tablet, Rfl: 1   Microlet Lancets MISC, 1 application by Does not apply route daily as needed., Disp: 90 each, Rfl: 0   naproxen (NAPROSYN) 500 MG tablet, Take 1 tablet (500 mg total) by mouth 2 (two) times daily., Disp: 30 tablet, Rfl: 0   naproxen (NAPROSYN) 500 MG tablet, Take 1 tablet by mouth 2 (two) times daily as needed., Disp: , Rfl:    naproxen sodium (ANAPROX DS) 550 MG tablet, Take 1 tablet (550 mg total) by mouth 2 (two) times daily with a meal., Disp: 30 tablet, Rfl: 0 Medication Side Effects: none  Family Medical/ Social History: Changes? No  MENTAL HEALTH  EXAM:  There were no vitals taken for this visit.There is no height or weight on file to calculate BMI.  General Appearance: Casual, Neat, and Well Groomed  Eye Contact:  Good  Speech:  Clear and Coherent  Volume:  Normal  Mood:  NA  Affect:  Appropriate  Thought Process:  Coherent  Orientation:  Full (Time, Place, and Person)  Thought Content: Logical   Suicidal Thoughts:  No  Homicidal Thoughts:  No  Memory:  WNL  Judgement:  Good  Insight:  Good  Psychomotor Activity:  Normal  Concentration:  Concentration: Good  Recall:  Good  Fund of Knowledge: Good  Language: Good  Assets:  Desire for Improvement   ADL's:  Intact  Cognition: WNL  Prognosis:  Good    DIAGNOSES:    ICD-10-CM   1. Attention deficit hyperactivity disorder (ADHD), combined type  F90.2 Brexpiprazole (REXULTI) 3 MG TABS    Dexmethylphenidate HCl 40 MG CP24      Receiving Psychotherapy: No    RECOMMENDATIONS:   Greater than 50% of 30 min. face to face time with patient was spent on counseling and coordination of care. We discussed patients current stability. He says that he has been doing pretty well. We discussed his recent break up with long time girl friend. They have agreed to remain friends. He still has hard time remembering to call in for refills for his meds. I recommended he set alarms or enter message on his calendar. Patient did not want to make medication changes at this time. Counseled patient on making sure he calls one week prior to running out of medications to avoid relapse.   Continue Rexulti 3 mg daily Continue dexmethylphenidate to 40 mg daily Will report any side effect or worsening symptoms He is now doing daily meditation with crystals and would like to consider mindfulness and yoga. To follow up in 5 months per pt due to financial concerns Reviewed PDMP Discussed potential benefits, risks, and side effects of stimulants with patient to include increased heart rate, palpitations, insomnia, increased anxiety, increased irritability, or decreased appetite.  Instructed patient to contact office if experiencing any significant tolerability issues.     Elwanda Brooklyn, NP

## 2022-04-24 ENCOUNTER — Encounter: Payer: Self-pay | Admitting: Family Medicine

## 2022-04-24 ENCOUNTER — Ambulatory Visit (INDEPENDENT_AMBULATORY_CARE_PROVIDER_SITE_OTHER): Payer: BC Managed Care – PPO | Admitting: Family Medicine

## 2022-04-24 VITALS — BP 123/80 | HR 78 | Temp 98.1°F | Resp 16 | Wt 260.4 lb

## 2022-04-24 DIAGNOSIS — F902 Attention-deficit hyperactivity disorder, combined type: Secondary | ICD-10-CM | POA: Diagnosis not present

## 2022-04-24 DIAGNOSIS — Z0289 Encounter for other administrative examinations: Secondary | ICD-10-CM

## 2022-04-24 DIAGNOSIS — E1165 Type 2 diabetes mellitus with hyperglycemia: Secondary | ICD-10-CM | POA: Diagnosis not present

## 2022-04-26 ENCOUNTER — Encounter: Payer: Self-pay | Admitting: Family Medicine

## 2022-04-26 NOTE — Progress Notes (Signed)
Established Patient Office Visit  Subjective    Patient ID: Jared Potts, male    DOB: 27-Jan-1996  Age: 26 y.o. MRN: [id number]  CC: No chief complaint on file.   HPI Jared Potts presents for completion of DMV form. Patient denies acute complaints or concerns.    Outpatient Encounter Medications as of 04/24/2022  Medication Sig   blood glucose meter kit and supplies KIT *ONETOUCH ULTRA* Use to test blood sugar daily as needed   Blood Glucose Monitoring Suppl (CONTOUR NEXT LINK) w/Device KIT USE TO TEST BLOOD SUGAR DAILY AS NEEDED   Brexpiprazole (REXULTI) 3 MG TABS Take 1 tablet (3 mg total) by mouth daily after breakfast.   Continuous Blood Gluc Receiver (FREESTYLE LIBRE 14 DAY READER) DEVI Use to check blood sugar TID. E11.65   Continuous Blood Gluc Receiver (FREESTYLE LIBRE READER) DEVI 1 kit by Does not apply route 4 (four) times daily -  before meals and at bedtime.   Continuous Blood Gluc Sensor (FREESTYLE LIBRE 14 DAY SENSOR) MISC Use to check blood sugar TID. E11.65   Continuous Blood Gluc Sensor (FREESTYLE LIBRE 2 SENSOR) MISC Utilize to monitor glucose as recommended/prn   cyclobenzaprine (FLEXERIL) 10 MG tablet Take 1 tablet (10 mg total) by mouth at bedtime.   Dexmethylphenidate HCl 40 MG CP24 Take 1 capsule (40 mg total) by mouth daily.   Dulaglutide (TRULICITY) 3 AP/0.1ID SOPN as directed Subcutaneous   glucose blood test strip Use as instructed   metFORMIN (GLUCOPHAGE) 1000 MG tablet TAKE 1 TABLET (1,000 MG TOTAL) BY MOUTH TWICE A DAY WITH FOOD   Microlet Lancets MISC 1 application by Does not apply route daily as needed.   naproxen (NAPROSYN) 500 MG tablet Take 1 tablet (500 mg total) by mouth 2 (two) times daily.   naproxen (NAPROSYN) 500 MG tablet Take 1 tablet by mouth 2 (two) times daily as needed.   naproxen sodium (ANAPROX DS) 550 MG tablet Take 1 tablet (550 mg total) by mouth 2 (two) times daily with a meal.   Dexmethylphenidate HCl 30 MG CP24 Take 1  capsule (30 mg total) by mouth daily. (Patient not taking: Reported on 10/06/2021)   [DISCONTINUED] dapagliflozin propanediol (FARXIGA) 10 MG TABS tablet Take 1 tablet by mouth daily.   [DISCONTINUED] sitaGLIPtin (JANUVIA) 100 MG tablet    No facility-administered encounter medications on file as of 04/24/2022.    Past Medical History:  Diagnosis Date   Attention deficit disorder    Combined hyperlipidemia    Goiter    Gynecomastia, male    Hypertension    Noncompliance with treatment    Obesity, Class III, BMI 40-49.9 (morbid obesity) (HCC)    Schizophrenia in children ([place])    Thyroiditis, autoimmune    Type 2 diabetes mellitus in patient age 33-19 years with HbA1C goal below 7.5     Past Surgical History:  Procedure Laterality Date   CIRCUMCISION      Family History  Problem Relation Age of Onset   Obesity Mother    Diabetes Maternal Aunt    Obesity Maternal Aunt    Diabetes Maternal Grandmother    Obesity Maternal Grandmother    Hypertension Father    Cancer Neg Hx     Social History   Socioeconomic History   Marital status: Single    Spouse name: Not on file   Number of children: Not on file   Years of education: Not on file   Highest education level: Not on  file  Occupational History   Not on file  Tobacco Use   Smoking status: Never    Passive exposure: Never   Smokeless tobacco: Never  Vaping Use   Vaping Use: Never used  Substance and Sexual Activity   Alcohol use: Yes    Comment: occasionally   Drug use: No   Sexual activity: Not Currently  Other Topics Concern   Not on file  Social History Narrative   Lives with mom and dad and brother. Likes to make music, hip-hop and rap. Participates in recording.   Social Determinants of Health   Financial Resource Strain: Not on file  Food Insecurity: Not on file  Transportation Needs: Not on file  Physical Activity: Not on file  Stress: Not on file  Social Connections: Not on file  Intimate Partner  Violence: Not on file    Review of Systems  All other systems reviewed and are negative.       Objective    BP 123/80   Pulse 78   Temp 98.1 F (36.7 C) (Oral)   Resp 16   Wt 260 lb 6.4 oz (118.1 kg)   SpO2 97%   BMI 31.70 kg/m   Physical Exam Vitals and nursing note reviewed.  Constitutional:      General: He is not in acute distress. Cardiovascular:     Rate and Rhythm: Normal rate and regular rhythm.  Pulmonary:     Effort: Pulmonary effort is normal.     Breath sounds: Normal breath sounds.  Abdominal:     Palpations: Abdomen is soft.     Tenderness: There is no abdominal tenderness.  Neurological:     General: No focal deficit present.     Mental Status: He is alert and oriented to person, place, and time.  Psychiatric:        Mood and Affect: Mood normal.        Behavior: Behavior normal.         Assessment & Plan:   1. Type 2 diabetes mellitus with hyperglycemia, without long-term current use of insulin (HCC) Appears stable   2. Attention deficit hyperactivity disorder (ADHD), combined type, moderate Appears stable. Management per consultant  3. Encounter for completion of form with patient DMV form completed    No follow-ups on file.   Becky Sax, MD

## 2022-05-08 ENCOUNTER — Other Ambulatory Visit: Payer: Self-pay | Admitting: Behavioral Health

## 2022-05-08 DIAGNOSIS — F902 Attention-deficit hyperactivity disorder, combined type: Secondary | ICD-10-CM

## 2022-06-08 ENCOUNTER — Telehealth: Payer: Self-pay | Admitting: Behavioral Health

## 2022-06-08 ENCOUNTER — Other Ambulatory Visit: Payer: Self-pay

## 2022-06-08 DIAGNOSIS — F902 Attention-deficit hyperactivity disorder, combined type: Secondary | ICD-10-CM

## 2022-06-08 NOTE — Telephone Encounter (Signed)
Pended.

## 2022-06-08 NOTE — Telephone Encounter (Signed)
Pt called at 4:57 pm and needs a refill on his focalin 40 mg.[facility] [address]

## 2022-06-12 MED ORDER — DEXMETHYLPHENIDATE HCL ER 40 MG PO CP24: 40.0000 mg | ORAL_CAPSULE | Freq: Every day | ORAL | 0 refills | Status: DC

## 2022-07-27 ENCOUNTER — Other Ambulatory Visit: Payer: Self-pay | Admitting: Behavioral Health

## 2022-07-27 ENCOUNTER — Telehealth: Payer: Self-pay | Admitting: Behavioral Health

## 2022-07-27 DIAGNOSIS — F902 Attention-deficit hyperactivity disorder, combined type: Secondary | ICD-10-CM

## 2022-07-27 MED ORDER — DEXMETHYLPHENIDATE HCL ER 40 MG PO CP24: 40.0000 mg | ORAL_CAPSULE | Freq: Every day | ORAL | 0 refills | Status: DC

## 2022-07-27 NOTE — Telephone Encounter (Signed)
Pt called at 3:15p to request [facility]   [id number] [address], [place] [place] [address] Phone: [phone]  Fax: [phone]   Next appt 3/28

## 2022-08-02 ENCOUNTER — Ambulatory Visit (INDEPENDENT_AMBULATORY_CARE_PROVIDER_SITE_OTHER): Payer: Self-pay | Admitting: Behavioral Health

## 2022-08-02 DIAGNOSIS — F489 Nonpsychotic mental disorder, unspecified: Secondary | ICD-10-CM

## 2022-08-02 NOTE — Progress Notes (Signed)
Pt did not show for schedule appt and did not provide 24 hour notice as required . Additional fees to be assessed.

## 2022-08-24 ENCOUNTER — Ambulatory Visit: Payer: BC Managed Care – PPO | Admitting: Family Medicine

## 2022-09-04 ENCOUNTER — Encounter: Payer: Self-pay | Admitting: Behavioral Health

## 2022-09-04 ENCOUNTER — Ambulatory Visit (INDEPENDENT_AMBULATORY_CARE_PROVIDER_SITE_OTHER): Payer: BC Managed Care – PPO | Admitting: Behavioral Health

## 2022-09-04 DIAGNOSIS — F902 Attention-deficit hyperactivity disorder, combined type: Secondary | ICD-10-CM | POA: Diagnosis not present

## 2022-09-04 MED ORDER — REXULTI 3 MG PO TABS: 3.0000 mg | ORAL_TABLET | Freq: Every day | ORAL | 3 refills | Status: DC

## 2022-09-04 MED ORDER — METHYLPHENIDATE HCL ER (OSM) 36 MG PO TBCR: 36.0000 mg | EXTENDED_RELEASE_TABLET | Freq: Every day | ORAL | 0 refills | Status: DC

## 2022-09-04 NOTE — Progress Notes (Signed)
[facility]  Patient ID: Jared Potts,  MRN: [id number]  PCP: Georganna Skeans, MD  Date of Evaluation: 09/04/2022 Time spent:30 minutes  Chief Complaint:  Chief Complaint   Anxiety; Depression; Follow-up; Patient Education; Medication Refill     HISTORY/CURRENT STATUS: HPI 27 year old male presents to this office for follow up and medication management. He says that he has been doing well for the most part but still is having problems managing medications and calling when he is running low on his script.  Frustrated about finding his dexmethylphenidate . He is requesting no medication changes now .  Says that he has been skipping breakfast some and notices he feels foggy. He is diabetic.  hE He sleeps 7 plus hours per night. No mania, no psychosis, No SI/HI.   Pt did not know his past psychiatric medication trials    Individual Medical History/ Review of Systems: Changes? :No   Allergies: Citrus  Current Medications:  Current Outpatient Medications:    methylphenidate (CONCERTA) 36 MG PO CR tablet, Take 1 tablet (36 mg total) by mouth daily., Disp: 30 tablet, Rfl: 0   blood glucose meter kit and supplies KIT, *ONETOUCH ULTRA* Use to test blood sugar daily as needed, Disp: 1 each, Rfl: 0   Blood Glucose Monitoring Suppl (CONTOUR NEXT LINK) w/Device KIT, USE TO TEST BLOOD SUGAR DAILY AS NEEDED, Disp: 1 kit, Rfl: 0   Brexpiprazole (REXULTI) 3 MG TABS, Take 1 tablet (3 mg total) by mouth daily after breakfast., Disp: 90 tablet, Rfl: 3   Continuous Blood Gluc Receiver (FREESTYLE LIBRE 14 DAY READER) DEVI, Use to check blood sugar TID. E11.65, Disp: 1 each, Rfl: 5   Continuous Blood Gluc Receiver (FREESTYLE LIBRE READER) DEVI, 1 kit by Does not apply route 4 (four) times daily -  before meals and at bedtime., Disp: 1 each, Rfl: 0   Continuous Blood Gluc Sensor (FREESTYLE LIBRE 14 DAY SENSOR) MISC, Use to check blood sugar TID. E11.65, Disp: 1 each, Rfl: 5   Continuous  Blood Gluc Sensor (FREESTYLE LIBRE 2 SENSOR) MISC, Utilize to monitor glucose as recommended/prn, Disp: 6 each, Rfl: 1   cyclobenzaprine (FLEXERIL) 10 MG tablet, Take 1 tablet (10 mg total) by mouth at bedtime., Disp: 10 tablet, Rfl: 0   Dexmethylphenidate HCl 30 MG CP24, Take 1 capsule (30 mg total) by mouth daily. (Patient not taking: Reported on 10/06/2021), Disp: 30 capsule, Rfl: 0   Dexmethylphenidate HCl 40 MG CP24, Take 1 capsule (40 mg total) by mouth daily., Disp: 30 capsule, Rfl: 0   Dulaglutide (TRULICITY) 3 MG/0.5ML SOPN, as directed Subcutaneous, Disp: 6 mL, Rfl: 1   glucose blood test strip, Use as instructed, Disp: 100 each, Rfl: 12   metFORMIN (GLUCOPHAGE) 1000 MG tablet, TAKE 1 TABLET (1,000 MG TOTAL) BY MOUTH TWICE A DAY WITH FOOD, Disp: 180 tablet, Rfl: 1   Microlet Lancets MISC, 1 application by Does not apply route daily as needed., Disp: 90 each, Rfl: 0   naproxen (NAPROSYN) 500 MG tablet, Take 1 tablet (500 mg total) by mouth 2 (two) times daily., Disp: 30 tablet, Rfl: 0   naproxen (NAPROSYN) 500 MG tablet, Take 1 tablet by mouth 2 (two) times daily as needed., Disp: , Rfl:    naproxen sodium (ANAPROX DS) 550 MG tablet, Take 1 tablet (550 mg total) by mouth 2 (two) times daily with a meal., Disp: 30 tablet, Rfl: 0 Medication Side Effects: none  Family Medical/ Social History: Changes? No  MENTAL HEALTH EXAM:  There were no vitals taken for this visit.There is no height or weight on file to calculate BMI.  General Appearance: Casual  Eye Contact:  Good  Speech:  Clear and Coherent  Volume:  Normal  Mood:  NA  Affect:  Appropriate  Thought Process:  Coherent  Orientation:  Full (Time, Place, and Person)  Thought Content: Logical   Suicidal Thoughts:  No  Homicidal Thoughts:  No  Memory:  WNL  Judgement:  Fair  Insight:  Fair  Psychomotor Activity:  Normal  Concentration:  Concentration: Fair  Recall:  [employer] of Knowledge: Fair  Language: Fair  Assets:   Desire for Improvement  ADL's:  Intact  Cognition: WNL  Prognosis:  Fair    DIAGNOSES:    ICD-10-CM   1. Attention deficit hyperactivity disorder (ADHD), combined type  F90.2 Brexpiprazole (REXULTI) 3 MG TABS    methylphenidate (CONCERTA) 36 MG PO CR tablet      Receiving Psychotherapy: No    RECOMMENDATIONS:   Greater than 50% of 30 min. face to face time with patient was spent on counseling and coordination of care. No changes this visit. We discussed patients current stability.  He still has hard time remembering to call in for refills for his meds. I recommended he set alarms or enter message on his calendar. Patient did not want to make medication changes at this time. Counseled patient on making sure he calls one week prior to running out of medications to avoid relapse.   Continue Rexulti 3 mg daily  Pharm did not have. Scientist, clinical (histocompatibility and immunogenetics). Stop dexmethylphenidate to 40 mg daily Will start Concerta 36 mg daily.   Will report any side effect or worsening symptoms He is now doing daily meditation with crystals and would like to consider mindfulness and yoga. To follow up in 3 months per pt due to financial concerns Reviewed PDMP Discussed potential benefits, risks, and side effects of stimulants with patient to include increased heart rate, palpitations, insomnia, increased anxiety, increased irritability, or decreased appetite.  Instructed patient to contact office if experiencing any significant tolerability issues.       Joan Flores, NP

## 2022-09-07 ENCOUNTER — Ambulatory Visit: Payer: BC Managed Care – PPO | Admitting: Family Medicine

## 2022-09-07 ENCOUNTER — Telehealth: Payer: Self-pay | Admitting: Behavioral Health

## 2022-09-07 ENCOUNTER — Other Ambulatory Visit: Payer: Self-pay | Admitting: Behavioral Health

## 2022-09-07 DIAGNOSIS — F902 Attention-deficit hyperactivity disorder, combined type: Secondary | ICD-10-CM

## 2022-09-07 MED ORDER — METHYLPHENIDATE HCL ER (OSM) 36 MG PO TBCR: 36.0000 mg | EXTENDED_RELEASE_TABLET | Freq: Every day | ORAL | 0 refills | Status: DC

## 2022-09-07 NOTE — Telephone Encounter (Signed)
Pt called and said that his pharmacy doesn't have the concerta in stock. [facility] cancel [facility] [address]

## 2022-09-14 ENCOUNTER — Ambulatory Visit: Payer: BC Managed Care – PPO | Admitting: Behavioral Health

## 2022-10-26 ENCOUNTER — Encounter: Payer: Self-pay | Admitting: Behavioral Health

## 2022-10-26 ENCOUNTER — Ambulatory Visit (INDEPENDENT_AMBULATORY_CARE_PROVIDER_SITE_OTHER): Payer: BC Managed Care – PPO | Admitting: Behavioral Health

## 2022-10-26 DIAGNOSIS — F902 Attention-deficit hyperactivity disorder, combined type: Secondary | ICD-10-CM | POA: Diagnosis not present

## 2022-10-26 MED ORDER — METHYLPHENIDATE HCL ER (OSM) 36 MG PO TBCR: 36.0000 mg | EXTENDED_RELEASE_TABLET | Freq: Every day | ORAL | 0 refills | Status: DC

## 2022-10-26 NOTE — Progress Notes (Signed)
[facility]  Patient ID: Jared Potts,  MRN: [id number]  PCP: Georganna Skeans, MD  Date of Evaluation: 10/26/2022 Time spent:30 minutes  Chief Complaint:  Chief Complaint   Anxiety; Depression; Follow-up; Patient Education; Medication Refill     HISTORY/CURRENT STATUS: HPI 27 year old male presents to this office for follow up and medication management. Says that he still having difficult time getting his Concerta. They always have to order it.  Has new girlfriend. Still working two part-time jobs.  He is requesting no medication changes now .  Starting to try to eat some breakfast when taking his meds. This has helped with fogginess. He is diabetic.  hE He sleeps 7 plus hours per night. No mania, no psychosis, No SI/HI.   Pt did not know his past psychiatric medication trials Individual Medical History/ Review of Systems: Changes? :No   Allergies: Citrus  Current Medications:  Current Outpatient Medications:    blood glucose meter kit and supplies KIT, *ONETOUCH ULTRA* Use to test blood sugar daily as needed, Disp: 1 each, Rfl: 0   Blood Glucose Monitoring Suppl (CONTOUR NEXT LINK) w/Device KIT, USE TO TEST BLOOD SUGAR DAILY AS NEEDED, Disp: 1 kit, Rfl: 0   Brexpiprazole (REXULTI) 3 MG TABS, Take 1 tablet (3 mg total) by mouth daily after breakfast., Disp: 90 tablet, Rfl: 3   Continuous Blood Gluc Receiver (FREESTYLE LIBRE 14 DAY READER) DEVI, Use to check blood sugar TID. E11.65, Disp: 1 each, Rfl: 5   Continuous Blood Gluc Receiver (FREESTYLE LIBRE READER) DEVI, 1 kit by Does not apply route 4 (four) times daily -  before meals and at bedtime., Disp: 1 each, Rfl: 0   Continuous Blood Gluc Sensor (FREESTYLE LIBRE 14 DAY SENSOR) MISC, Use to check blood sugar TID. E11.65, Disp: 1 each, Rfl: 5   Continuous Blood Gluc Sensor (FREESTYLE LIBRE 2 SENSOR) MISC, Utilize to monitor glucose as recommended/prn, Disp: 6 each, Rfl: 1   cyclobenzaprine (FLEXERIL) 10 MG tablet,  Take 1 tablet (10 mg total) by mouth at bedtime., Disp: 10 tablet, Rfl: 0   Dexmethylphenidate HCl 30 MG CP24, Take 1 capsule (30 mg total) by mouth daily. (Patient not taking: Reported on 10/06/2021), Disp: 30 capsule, Rfl: 0   Dexmethylphenidate HCl 40 MG CP24, Take 1 capsule (40 mg total) by mouth daily., Disp: 30 capsule, Rfl: 0   Dulaglutide (TRULICITY) 3 MG/0.5ML SOPN, as directed Subcutaneous, Disp: 6 mL, Rfl: 1   glucose blood test strip, Use as instructed, Disp: 100 each, Rfl: 12   metFORMIN (GLUCOPHAGE) 1000 MG tablet, TAKE 1 TABLET (1,000 MG TOTAL) BY MOUTH TWICE A DAY WITH FOOD, Disp: 180 tablet, Rfl: 1   methylphenidate (CONCERTA) 36 MG PO CR tablet, Take 1 tablet (36 mg total) by mouth daily., Disp: 30 tablet, Rfl: 0   Microlet Lancets MISC, 1 application by Does not apply route daily as needed., Disp: 90 each, Rfl: 0   naproxen (NAPROSYN) 500 MG tablet, Take 1 tablet (500 mg total) by mouth 2 (two) times daily., Disp: 30 tablet, Rfl: 0   naproxen (NAPROSYN) 500 MG tablet, Take 1 tablet by mouth 2 (two) times daily as needed., Disp: , Rfl:    naproxen sodium (ANAPROX DS) 550 MG tablet, Take 1 tablet (550 mg total) by mouth 2 (two) times daily with a meal., Disp: 30 tablet, Rfl: 0 Medication Side Effects: none  Family Medical/ Social History: Changes? No  MENTAL HEALTH EXAM:  There were no vitals taken for  this visit.There is no height or weight on file to calculate BMI.  General Appearance: Casual, Neat, and Well Groomed  Eye Contact:  Good  Speech:  Clear and Coherent  Volume:  Normal  Mood:  NA  Affect:  Appropriate  Thought Process:  Coherent  Orientation:  Full (Time, Place, and Person)  Thought Content: Logical   Suicidal Thoughts:  No  Homicidal Thoughts:  No  Memory:  WNL  Judgement:  Good  Insight:  Good  Psychomotor Activity:  Normal  Concentration:  Concentration: Good  Recall:  Good  Fund of Knowledge: Good  Language: Good  Assets:  Desire for Improvement   ADL's:  Intact  Cognition: WNL  Prognosis:  Good    DIAGNOSES:    ICD-10-CM   1. Attention deficit hyperactivity disorder (ADHD), combined type  F90.2 methylphenidate (CONCERTA) 36 MG PO CR tablet      Receiving Psychotherapy: No    RECOMMENDATIONS:   Greater than 50% of 30 min. face to face time with patient was spent on counseling and coordination of care. No changes this visit. We discussed patients current stability.  He is still doing well. Consistently working. Active social life. Patient did not want to make medication changes at this time. Counseled patient on making sure he calls one week prior to running out of medications to avoid relapse.   Continue Rexulti 3 mg daily   Pharm did not have. Scientist, clinical (histocompatibility and immunogenetics). Stop dexmethylphenidate to 40 mg daily Will start Concerta 36 mg daily.    Will report any side effect or worsening symptoms He is now doing daily meditation with crystals and would like to consider mindfulness and yoga. To follow up in 3 months per pt due to financial concerns Aims score=0 Reviewed PDMP Discussed potential benefits, risks, and side effects of stimulants with patient to include increased heart rate, palpitations, insomnia, increased anxiety, increased irritability, or decreased appetite.  Instructed patient to contact office if experiencing any significant tolerability issues.      Joan Flores, NP

## 2023-01-04 ENCOUNTER — Other Ambulatory Visit: Payer: Self-pay

## 2023-01-04 ENCOUNTER — Telehealth: Payer: Self-pay | Admitting: Behavioral Health

## 2023-01-04 DIAGNOSIS — F902 Attention-deficit hyperactivity disorder, combined type: Secondary | ICD-10-CM

## 2023-01-04 MED ORDER — METHYLPHENIDATE HCL ER (OSM) 36 MG PO TBCR: 36.0000 mg | EXTENDED_RELEASE_TABLET | Freq: Every day | ORAL | 0 refills | Status: DC

## 2023-01-04 NOTE — Telephone Encounter (Signed)
Pended.

## 2023-01-04 NOTE — Telephone Encounter (Signed)
PT called requesting a refill on his concerta 36 mg. He said that the [facility] on [address] has it in stock

## 2023-01-25 ENCOUNTER — Ambulatory Visit (INDEPENDENT_AMBULATORY_CARE_PROVIDER_SITE_OTHER): Payer: Self-pay | Admitting: Behavioral Health

## 2023-01-25 DIAGNOSIS — Z0389 Encounter for observation for other suspected diseases and conditions ruled out: Secondary | ICD-10-CM

## 2023-01-25 NOTE — Progress Notes (Signed)
Pt did not show for schedule appt and did not provide 24 hour notice as required . Additional fees to be assessed.

## 2023-02-05 ENCOUNTER — Ambulatory Visit: Payer: BC Managed Care – PPO | Admitting: Family Medicine

## 2023-02-05 DIAGNOSIS — E1165 Type 2 diabetes mellitus with hyperglycemia: Secondary | ICD-10-CM

## 2023-02-26 ENCOUNTER — Ambulatory Visit: Payer: BC Managed Care – PPO | Admitting: Behavioral Health

## 2023-02-26 ENCOUNTER — Encounter: Payer: Self-pay | Admitting: Behavioral Health

## 2023-02-26 DIAGNOSIS — F902 Attention-deficit hyperactivity disorder, combined type: Secondary | ICD-10-CM

## 2023-02-26 DIAGNOSIS — F250 Schizoaffective disorder, bipolar type: Secondary | ICD-10-CM

## 2023-02-26 DIAGNOSIS — G4726 Circadian rhythm sleep disorder, shift work type: Secondary | ICD-10-CM | POA: Diagnosis not present

## 2023-02-26 MED ORDER — DEXMETHYLPHENIDATE HCL ER 40 MG PO CP24
40.0000 mg | ORAL_CAPSULE | Freq: Every day | ORAL | 0 refills | Status: DC
Start: 2023-02-26 — End: 2023-04-09

## 2023-02-26 MED ORDER — REXULTI 3 MG PO TABS
3.0000 mg | ORAL_TABLET | Freq: Every day | ORAL | 3 refills | Status: DC
Start: 2023-02-26 — End: 2023-05-29

## 2023-02-26 NOTE — Progress Notes (Signed)
[facility]  Patient ID: Jared Potts,  MRN: [id number]  PCP: Georganna Skeans, MD  Date of Evaluation: 02/26/2023 Time spent:30 minutes  Chief Complaint:  Chief Complaint   Anxiety; Depression; Follow-up; Patient Education; Medication Refill     HISTORY/CURRENT STATUS: HPI  27 year old male presents to this office for follow up and medication management. Very pleasant and talkative as usual today.  Says he has been very stable lately but has lost a lot of weight. He is 213 lbs today and noticeably thinner. Says he has PCP appt on Thursday. Admits to not taking his medication for diabetes.  Has new girlfriend. Still working two part-time jobs.  He is requesting no medication changes now .  Starting to try to eat some breakfast when taking his meds. This has helped with fogginess. He is diabetic.  hE He sleeps 7 plus hours per night. No mania, no psychosis, No SI/HI.   Individual Medical History/ Review of Systems: Changes? :No   Allergies: Citrus  Current Medications:  Current Outpatient Medications:    amoxicillin (AMOXIL) 500 MG tablet, Take 500 mg by mouth 3 (three) times daily., Disp: , Rfl:    blood glucose meter kit and supplies KIT, *ONETOUCH ULTRA* Use to test blood sugar daily as needed, Disp: 1 each, Rfl: 0   Blood Glucose Monitoring Suppl (CONTOUR NEXT LINK) w/Device KIT, USE TO TEST BLOOD SUGAR DAILY AS NEEDED, Disp: 1 kit, Rfl: 0   Brexpiprazole (REXULTI) 3 MG TABS, Take 1 tablet (3 mg total) by mouth daily after breakfast., Disp: 90 tablet, Rfl: 3   Continuous Blood Gluc Receiver (FREESTYLE LIBRE 14 DAY READER) DEVI, Use to check blood sugar TID. E11.65, Disp: 1 each, Rfl: 5   Continuous Blood Gluc Receiver (FREESTYLE LIBRE READER) DEVI, 1 kit by Does not apply route 4 (four) times daily -  before meals and at bedtime., Disp: 1 each, Rfl: 0   Continuous Blood Gluc Sensor (FREESTYLE LIBRE 14 DAY SENSOR) MISC, Use to check blood sugar TID. E11.65, Disp: 1  each, Rfl: 5   Continuous Blood Gluc Sensor (FREESTYLE LIBRE 2 SENSOR) MISC, Utilize to monitor glucose as recommended/prn, Disp: 6 each, Rfl: 1   cyclobenzaprine (FLEXERIL) 10 MG tablet, Take 1 tablet (10 mg total) by mouth at bedtime., Disp: 10 tablet, Rfl: 0   Dexmethylphenidate HCl 30 MG CP24, Take 1 capsule (30 mg total) by mouth daily. (Patient not taking: Reported on 10/06/2021), Disp: 30 capsule, Rfl: 0   Dexmethylphenidate HCl 40 MG CP24, Take 1 capsule (40 mg total) by mouth daily., Disp: 30 capsule, Rfl: 0   Dulaglutide (TRULICITY) 3 MG/0.5ML SOPN, as directed Subcutaneous, Disp: 6 mL, Rfl: 1   glucose blood test strip, Use as instructed, Disp: 100 each, Rfl: 12   metFORMIN (GLUCOPHAGE) 1000 MG tablet, TAKE 1 TABLET (1,000 MG TOTAL) BY MOUTH TWICE A DAY WITH FOOD, Disp: 180 tablet, Rfl: 1   methylphenidate (CONCERTA) 36 MG PO CR tablet, Take 1 tablet (36 mg total) by mouth daily., Disp: 30 tablet, Rfl: 0   Microlet Lancets MISC, 1 application by Does not apply route daily as needed., Disp: 90 each, Rfl: 0   naproxen (NAPROSYN) 500 MG tablet, Take 1 tablet (500 mg total) by mouth 2 (two) times daily., Disp: 30 tablet, Rfl: 0   naproxen (NAPROSYN) 500 MG tablet, Take 1 tablet by mouth 2 (two) times daily as needed., Disp: , Rfl:    naproxen sodium (ANAPROX DS) 550 MG tablet, Take  1 tablet (550 mg total) by mouth 2 (two) times daily with a meal., Disp: 30 tablet, Rfl: 0 Medication Side Effects: none  Family Medical/ Social History: Changes? No  MENTAL HEALTH EXAM:  There were no vitals taken for this visit.There is no height or weight on file to calculate BMI.  General Appearance: Casual  Eye Contact:  Good  Speech:  Clear and Coherent  Volume:  Normal  Mood:  NA  Affect:  Appropriate  Thought Process:  Coherent  Orientation:  Full (Time, Place, and Person)  Thought Content: Logical   Suicidal Thoughts:  No  Homicidal Thoughts:  No  Memory:  WNL  Judgement:  Good  Insight:   Good  Psychomotor Activity:  Normal  Concentration:  Concentration: Good  Recall:  Good  Fund of Knowledge: Good  Language: Good  Assets:  Desire for Improvement  ADL's:  Intact  Cognition: WNL  Prognosis:  Good    DIAGNOSES:    ICD-10-CM   1. Schizoaffective disorder, bipolar type (HCC)  F25.0     2. Attention deficit hyperactivity disorder (ADHD), combined type  F90.2 Dexmethylphenidate HCl 40 MG CP24    Brexpiprazole (REXULTI) 3 MG TABS    3. 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 Attention deficit hyperactivity disorder (ADHD), combined type, moderate  F90.2       Receiving Psychotherapy: No    RECOMMENDATIONS:  Greater than 50% of 30 min. face to face time with patient was spent on counseling and coordination of care. Discussed his weight loss today. He says he has been taking "sea moss gel".  He also says he has not been taking his insulin recently and not checking blood sugars. I counseled him on how important this was and advised him to follow up with PCP for labs and consult. Says he has appt on this Thursday. We talked about his continued psychiatric stability with Rexulti. Consistently working. Active social life. Patient did not want to make medication changes at this time. Counseled patient on making sure he calls one week prior to running out of medications to avoid relapse.   Continue Rexulti 3 mg daily   Pt wants to restart Dexmethylphenidate 40 mg and stop  Concerta 36 mg daily. RX sent.   Will report any side effect or worsening symptoms He is now doing daily meditation with crystals and would like to consider mindfulness and yoga. To follow up in 3 months per pt due to financial concerns Aims score=0 02/26/2023 Reviewed PDMP Discussed potential benefits, risks, and side effects of stimulants with patient to include increased heart rate, palpitations, insomnia, increased anxiety, increased irritability, or decreased appetite.   Instructed patient to contact office if experiencing any significant tolerability issues.         Joan Flores, NP

## 2023-03-06 ENCOUNTER — Encounter: Payer: Self-pay | Admitting: Family Medicine

## 2023-03-06 ENCOUNTER — Ambulatory Visit (INDEPENDENT_AMBULATORY_CARE_PROVIDER_SITE_OTHER): Payer: BC Managed Care – PPO | Admitting: Family Medicine

## 2023-03-06 VITALS — BP 120/77 | HR 92 | Temp 98.6°F | Resp 16 | Ht 74.5 in | Wt 213.8 lb

## 2023-03-06 DIAGNOSIS — Z7984 Long term (current) use of oral hypoglycemic drugs: Secondary | ICD-10-CM | POA: Diagnosis not present

## 2023-03-06 DIAGNOSIS — E119 Type 2 diabetes mellitus without complications: Secondary | ICD-10-CM

## 2023-03-06 DIAGNOSIS — Z7985 Long-term (current) use of injectable non-insulin antidiabetic drugs: Secondary | ICD-10-CM

## 2023-03-06 DIAGNOSIS — E1165 Type 2 diabetes mellitus with hyperglycemia: Secondary | ICD-10-CM

## 2023-03-06 DIAGNOSIS — E11649 Type 2 diabetes mellitus with hypoglycemia without coma: Secondary | ICD-10-CM | POA: Diagnosis not present

## 2023-03-06 LAB — POCT GLYCOSYLATED HEMOGLOBIN (HGB A1C): Hemoglobin A1C: 14.3 % — AB (ref 4.0–5.6)

## 2023-03-06 MED ORDER — TRULICITY 3 MG/0.5ML ~~LOC~~ SOAJ: SUBCUTANEOUS | 1 refills | Status: DC

## 2023-03-06 MED ORDER — FREESTYLE LIBRE 3 SENSOR MISC: 5 refills | Status: DC

## 2023-03-07 LAB — MICROALBUMIN / CREATININE URINE RATIO
Creatinine, Urine: 77.2 mg/dL
Microalb/Creat Ratio: 4 mg/g{creat} (ref 0–29)
Microalbumin, Urine: 3.3 ug/mL

## 2023-03-08 ENCOUNTER — Encounter: Payer: Self-pay | Admitting: Family Medicine

## 2023-03-08 NOTE — Progress Notes (Signed)
Established Patient Office Visit  Subjective    Patient ID: Jared Potts, male    DOB: 04-Aug-1995  Age: 27 y.o. MRN: [id number]  CC:  Chief Complaint  Patient presents with   Follow-up    Refills, blood work, A1C checked    HPI Jared Potts presents for follow up of diabetes. Patient reports that he has not been consistent at all with his meds and in fact has not been seen here in the office for more than 10 months. He reports some polyuria.  Outpatient Encounter Medications as of 03/06/2023  Medication Sig   amoxicillin (AMOXIL) 500 MG tablet Take 500 mg by mouth 3 (three) times daily.   blood glucose meter kit and supplies KIT *ONETOUCH ULTRA* Use to test blood sugar daily as needed   Blood Glucose Monitoring Suppl (CONTOUR NEXT LINK) w/Device KIT USE TO TEST BLOOD SUGAR DAILY AS NEEDED   Brexpiprazole (REXULTI) 3 MG TABS Take 1 tablet (3 mg total) by mouth daily after breakfast.   Continuous Blood Gluc Receiver (FREESTYLE LIBRE 14 DAY READER) DEVI Use to check blood sugar TID. E11.65   Continuous Blood Gluc Receiver (FREESTYLE LIBRE READER) DEVI 1 kit by Does not apply route 4 (four) times daily -  before meals and at bedtime.   Continuous Blood Gluc Sensor (FREESTYLE LIBRE 14 DAY SENSOR) MISC Use to check blood sugar TID. E11.65   Continuous Blood Gluc Sensor (FREESTYLE LIBRE 2 SENSOR) MISC Utilize to monitor glucose as recommended/prn   Continuous Glucose Sensor (FREESTYLE LIBRE 3 SENSOR) MISC Utilize for daily glucose readings   cyclobenzaprine (FLEXERIL) 10 MG tablet Take 1 tablet (10 mg total) by mouth at bedtime.   Dexmethylphenidate HCl 40 MG CP24 Take 1 capsule (40 mg total) by mouth daily.   glucose blood test strip Use as instructed   metFORMIN (GLUCOPHAGE) 1000 MG tablet TAKE 1 TABLET (1,000 MG TOTAL) BY MOUTH TWICE A DAY WITH FOOD   methylphenidate (CONCERTA) 36 MG PO CR tablet Take 1 tablet (36 mg total) by mouth daily.   Microlet Lancets MISC 1 application by  Does not apply route daily as needed.   naproxen (NAPROSYN) 500 MG tablet Take 1 tablet (500 mg total) by mouth 2 (two) times daily.   naproxen (NAPROSYN) 500 MG tablet Take 1 tablet by mouth 2 (two) times daily as needed.   naproxen sodium (ANAPROX DS) 550 MG tablet Take 1 tablet (550 mg total) by mouth 2 (two) times daily with a meal.   [DISCONTINUED] Dulaglutide (TRULICITY) 3 MG/0.5ML SOPN as directed Subcutaneous   Dexmethylphenidate HCl 30 MG CP24 Take 1 capsule (30 mg total) by mouth daily. (Patient not taking: Reported on 10/06/2021)   Dulaglutide (TRULICITY) 3 MG/0.5ML SOAJ as directed Subcutaneous   No facility-administered encounter medications on file as of 03/06/2023.    Past Medical History:  Diagnosis Date   Attention deficit disorder    Combined hyperlipidemia    Goiter    Gynecomastia, male    Hypertension    Noncompliance with treatment    Obesity, Class III, BMI 40-49.9 (morbid obesity) (HCC)    Schizophrenia in children (HCC)    Thyroiditis, autoimmune    Type 2 diabetes mellitus in patient age 57-19 years with HbA1C goal below 7.5     Past Surgical History:  Procedure Laterality Date   CIRCUMCISION      Family History  Problem Relation Age of Onset   Obesity Mother    Diabetes Maternal Aunt  Obesity Maternal Aunt    Diabetes Maternal Grandmother    Obesity Maternal Grandmother    Hypertension Father    Cancer Neg Hx     Social History   Socioeconomic History   Marital status: Single    Spouse name: Not on file   Number of children: Not on file   Years of education: Not on file   Highest education level: Not on file  Occupational History   Not on file  Tobacco Use   Smoking status: Never    Passive exposure: Never   Smokeless tobacco: Never  Vaping Use   Vaping status: Never Used  Substance and Sexual Activity   Alcohol use: Yes    Comment: occasionally   Drug use: No   Sexual activity: Not Currently  Other Topics Concern   Not on file   Social History Narrative   Lives with mom and dad and brother. Likes to make music, hip-hop and rap. Participates in recording.   Social Determinants of Health   Financial Resource Strain: Medium Risk (03/06/2023)   Overall Financial Resource Strain (CARDIA)    Difficulty of Paying Living Expenses: Somewhat hard  Food Insecurity: Food Insecurity Present (03/06/2023)   Hunger Vital Sign    Worried About Running Out of Food in the Last Year: Sometimes true    Ran Out of Food in the Last Year: Sometimes true  Transportation Needs: No Transportation Needs (03/06/2023)   PRAPARE - Administrator, Civil Service (Medical): No    Lack of Transportation (Non-Medical): No  Physical Activity: Sufficiently Active (03/06/2023)   Exercise Vital Sign    Days of Exercise per Week: 7 days    Minutes of Exercise per Session: 30 min  Stress: No Stress Concern Present (03/06/2023)   [employer] of Occupational Health - Occupational Stress Questionnaire    Feeling of Stress : Not at all  Social Connections: Moderately Isolated (03/06/2023)   Social Connection and Isolation Panel [NHANES]    Frequency of Communication with Friends and Family: More than three times a week    Frequency of Social Gatherings with Friends and Family: More than three times a week    Attends Religious Services: More than 4 times per year    Active Member of [employer] or Organizations: No    Attends Banker Meetings: Never    Marital Status: Never married  Intimate Partner Violence: Not At Risk (03/06/2023)   Humiliation, Afraid, Rape, and Kick questionnaire    Fear of Current or Ex-Partner: No    Emotionally Abused: No    Physically Abused: No    Sexually Abused: No    Review of Systems  All other systems reviewed and are negative.       Objective    BP 120/77 (BP Location: Left Arm, Patient Position: Sitting, Cuff Size: Normal)   Pulse 92   Temp 98.6 F (37 C) (Oral)   Resp 16    Ht 6' 2.5" (1.892 m)   Wt 213 lb 12.8 oz (97 kg)   SpO2 95%   BMI 27.08 kg/m   Physical Exam Vitals and nursing note reviewed.  Constitutional:      General: He is not in acute distress. Cardiovascular:     Rate and Rhythm: Normal rate and regular rhythm.  Pulmonary:     Effort: Pulmonary effort is normal.     Breath sounds: Normal breath sounds.  Abdominal:     Palpations: Abdomen is soft.  Tenderness: There is no abdominal tenderness.  Neurological:     General: No focal deficit present.     Mental Status: He is alert and oriented to person, place, and time.  Psychiatric:        Mood and Affect: Mood normal.        Behavior: Behavior normal.         Assessment & Plan:  1. Uncontrolled type 2 diabetes mellitus with hypoglycemia without coma (HCC) A1c is elevated way above goal. Discussed compliance at length. Referred to [facility] for med management. Present meds refilled.  - POCT glycosylated hemoglobin (Hb A1C) - Urine Albumin/Creatinine with ratio (send out) [LAB689]  2. Long-term current use of injectable noninsulin antidiabetic medication   3. Diabetes mellitus treated with oral medication (HCC)     Return in about 3 months (around 06/06/2023) for follow up.   Tommie Raymond, MD

## 2023-03-27 NOTE — Progress Notes (Unsigned)
S:    27 y.o. male who presents for diabetes evaluation, education, and management. PMH is significant for T2DM, obesity, HLD, gynecomastia, schizophrenia, and ADHD.   Patient was referred and last seen by Primary Care Provider, Dr. Andrey Campanile, on 03/06/23, which was patient's first office visit in over 10 months. At this visit, patient reported not being consistent with meds and some complaints of polyuria. A1c found to be 14.3%, which was significant increase from 1 year prior of 6.1%. All medications were continued.  Today is patient's first PharmD visit. Patient arrives in *** good spirits and presents without *** any assistance. ***Patient is accompanied by ***.  What meds are being taken for DM? ** -If taking meds, Denies nausea, vomiting, abdominal pain, and diarrhea. ***  Fill history: Last filled 90 days of Trulicity 3mg  in 09/2021. Last filled 90 days of metformin in 02/2022. Last filled Freestyle Libre sensors in 09/2021.  Patient reports Diabetes was diagnosed in 2011. (Extensive history in 04/27/2016 note from Gretchen Short, NP)  Family/Social History: -Fhx: DM, HTN, obesity -Tobacco: never -Alcohol: ***  Current diabetes medications include: Trulicity 3mg  weekly, metformin 1000mg  BID  Patient reports adherence to taking all medications as prescribed.  *** Patient denies adherence with medications, reports missing *** medications *** times per week, on average.  Insurance coverage: BCBS  Patient {Actions; denies-reports:120008} hypoglycemic events.  Reported home fasting blood sugars: ***  Reported 2 hour post-meal/random blood sugars: ***.  Patient {Actions; denies-reports:120008} nocturia (nighttime urination).  Patient {Actions; denies-reports:120008} neuropathy (nerve pain). Patient {Actions; denies-reports:120008} visual changes. Patient {Actions; denies-reports:120008} self foot exams.   Patient reported dietary habits: Eats *** meals/day Breakfast:  *** Lunch: *** Dinner: *** Snacks: *** Drinks: ***  Patient-reported exercise habits: ***  O:  7 day average blood glucose: ***  Libre3 *** CGM Download today *** on *** % Time CGM is active: ***% Average Glucose: *** mg/dL Glucose Management Indicator: ***  Glucose Variability: ***% (goal <36%) Time in Goal:  - Time in range 70-180: ***% - Time above range: ***% - Time below range: ***% Observed patterns:   Lab Results  Component Value Date   HGBA1C 14.3 (A) 03/06/2023   There were no vitals filed for this visit.  Lipid Panel     Component Value Date/Time   CHOL 174 04/12/2017 1613   TRIG 88.0 04/12/2017 1613   HDL 49.90 04/12/2017 1613   CHOLHDL 3 04/12/2017 1613   VLDL 17.6 04/12/2017 1613   LDLCALC 106 (H) 04/12/2017 1613   LDLDIRECT 119 (H) 11/26/2019 1609    Clinical Atherosclerotic Cardiovascular Disease (ASCVD): No  The ASCVD Risk score (Arnett DK, et al., 2019) failed to calculate for the following reasons:   The 2019 ASCVD risk score is only valid for ages 69 to 1   A/P: Diabetes longstanding currently uncontrolled based on current A1c of 14.3% (02/2023), which had significantly increased from A1c of 6.1% 1 year prior (02/2022). Patient is *** able to verbalize appropriate hypoglycemia management plan. Control is suboptimal due to poor medication adherence. UACR WNL as of 03/06/23. -Restart Trulicity at 0.75mg  weekly -Restart metformin 1000mg  BID -Patient educated on purpose, proper use, and potential adverse effects of ***.  -Extensively discussed pathophysiology of diabetes, recommended lifestyle interventions, dietary effects on blood sugar control.  -Counseled on s/sx of and management of hypoglycemia.  -Next A1c anticipated 05/2023.   Written patient instructions provided. Patient verbalized understanding of treatment plan.  Total time in face to face counseling *** minutes.  Follow-up:  Pharmacist: *** PCP clinic visit: not  scheduled  Nicole Kindred, PharmD PGY1 Pharmacy Resident 03/27/2023 1:47 PM

## 2023-03-28 ENCOUNTER — Ambulatory Visit: Payer: BC Managed Care – PPO | Admitting: Pharmacist

## 2023-03-31 NOTE — Progress Notes (Unsigned)
S:     No chief complaint on file.  27 y.o. male who presents for diabetes evaluation, education, and management. Patient arrives in *** good spirits and presents without *** any assistance. ***Patient is accompanied by ***.   Patient was referred and last seen by Primary Care Provider, Dr. Andrey Campanile, on 03/06/23 at which time patient reported inconsistency with medications and polyuria. A1c was 14.3% and Trulicity 3 mg was reordered.   PMH is significant for T2DM, obesity, schizoaffective disorder, ADHD.   Patient reports Diabetes was diagnosed in ***. Today ***  Family/Social History: ***  Current diabetes medications include: Trulicity 3 mg weekly Current hypertension medications include: *** Current hyperlipidemia medications include: ***  Patient reports adherence to taking all medications as prescribed.  *** Patient denies adherence with medications, reports missing *** medications *** times per week, on average.  Do you feel that your medications are working for you? {YES NO:22349} Have you been experiencing any side effects to the medications prescribed? {YES NO:22349} Do you have any problems obtaining medications due to transportation or finances? {YES [id number] Insurance coverage: ***  Patient {Actions; denies-reports:120008} hypoglycemic events.  Reported home fasting blood sugars: ***  Reported 2 hour post-meal/random blood sugars: ***.  Patient {Actions; denies-reports:120008} nocturia (nighttime urination).  Patient {Actions; denies-reports:120008} neuropathy (nerve pain). Patient {Actions; denies-reports:120008} visual changes. Patient {Actions; denies-reports:120008} self foot exams.   Patient reported dietary habits: Eats *** meals/day Breakfast: *** Lunch: *** Dinner: *** Snacks: *** Drinks: ***  Patient-reported exercise habits: ***   O:   ROS  Physical Exam  7 day average blood glucose: ***  Libre3 *** CGM Download today *** on *** % Time  CGM is active: ***% Average Glucose: *** mg/dL Glucose Management Indicator: ***  Glucose Variability: ***% (goal <36%) Time in Goal:  - Time in range 70-180: ***% - Time above range: ***% - Time below range: ***% Observed patterns:   Lab Results  Component Value Date   HGBA1C 14.3 (A) 03/06/2023   There were no vitals filed for this visit.  Lipid Panel     Component Value Date/Time   CHOL 174 04/12/2017 1613   TRIG 88.0 04/12/2017 1613   HDL 49.90 04/12/2017 1613   CHOLHDL 3 04/12/2017 1613   VLDL 17.6 04/12/2017 1613   LDLCALC 106 (H) 04/12/2017 1613   LDLDIRECT 119 (H) 11/26/2019 1609    Clinical Atherosclerotic Cardiovascular Disease (ASCVD): {YES/NO:21197} The ASCVD Risk score (Arnett DK, et al., 2019) failed to calculate for the following reasons:   The 2019 ASCVD risk score is only valid for ages 61 to 29   Patient is participating in a Managed Medicaid Plan:  {MM YES/NO:27447::"Yes"}   A/P: Diabetes longstanding *** currently ***. Patient is *** able to verbalize appropriate hypoglycemia management plan. Medication adherence appears ***. Control is suboptimal due to ***. -{Meds adjust:18428} basal insulin *** Lantus/Basaglar/Semglee (insulin glargine) *** Tresiba (insulin degludec) from *** units to *** units daily in the morning. Patient will continue to titrate 1 unit every *** days if fasting blood sugar > 100mg /dl until fasting blood sugars reach goal or next visit.  -{Meds adjust:18428} rapid insulin *** Novolog (insulin aspart) *** Humalog (insulin lispro) from *** to ***.  -{Meds adjust:18428} GLP-1 *** Trulicity (dulaglutide) *** Ozempic (semaglutide) *** Mounjaro (tirzepatide) from *** mg to *** mg .  -{Meds adjust:18428} SGLT2-I *** Farxiga (dapagliflozin) *** Jardiance (empagliflozin) 10 mg. Counseled on sick day rules. -{Meds adjust:18428} metformin ***.  -Patient educated on purpose, proper use,  and potential adverse effects of ***.  -Extensively  discussed pathophysiology of diabetes, recommended lifestyle interventions, dietary effects on blood sugar control.  -Counseled on s/sx of and management of hypoglycemia.  -Next A1c anticipated ***.   ASCVD risk - primary ***secondary prevention in patient with diabetes. Last LDL is *** not at goal of <84 *** mg/dL. ASCVD risk factors include *** and 10-year ASCVD risk score of ***. {Desc; low/moderate/high:110033} intensity statin indicated.  -{Meds adjust:18428} ***statin *** mg.   Hypertension longstanding *** currently ***. Blood pressure goal of <130/80 *** mmHg. Medication adherence ***. Blood pressure control is suboptimal due to ***. -{Meds adjust:18428} *** mg.  Written patient instructions provided. Patient verbalized understanding of treatment plan.  Total time in face to face counseling *** minutes.    Follow-up:  Pharmacist *** PCP clinic visit in *** Patient seen with ***   Jarrett Ables, PharmD PGY-1 Pharmacy Resident

## 2023-04-01 ENCOUNTER — Ambulatory Visit: Payer: BC Managed Care – PPO | Attending: Family Medicine | Admitting: Pharmacist

## 2023-04-01 ENCOUNTER — Telehealth: Payer: Self-pay | Admitting: Pharmacist

## 2023-04-01 ENCOUNTER — Encounter: Payer: Self-pay | Admitting: Family Medicine

## 2023-04-01 ENCOUNTER — Encounter: Payer: Self-pay | Admitting: Pharmacist

## 2023-04-01 ENCOUNTER — Other Ambulatory Visit: Payer: Self-pay

## 2023-04-01 ENCOUNTER — Other Ambulatory Visit: Payer: Self-pay | Admitting: Pharmacist

## 2023-04-01 DIAGNOSIS — E1165 Type 2 diabetes mellitus with hyperglycemia: Secondary | ICD-10-CM | POA: Diagnosis not present

## 2023-04-01 DIAGNOSIS — Z7985 Long-term (current) use of injectable non-insulin antidiabetic drugs: Secondary | ICD-10-CM

## 2023-04-01 DIAGNOSIS — Z7984 Long term (current) use of oral hypoglycemic drugs: Secondary | ICD-10-CM

## 2023-04-01 MED ORDER — FREESTYLE LIBRE 3 PLUS SENSOR MISC: 6 refills | Status: DC

## 2023-04-01 MED ORDER — METFORMIN HCL 1000 MG PO TABS: ORAL_TABLET | ORAL | 1 refills | Status: AC

## 2023-04-01 MED ORDER — FREESTYLE LIBRE 3 READER DEVI: 0 refills | Status: DC

## 2023-04-01 NOTE — Progress Notes (Signed)
Opened in error

## 2023-04-01 NOTE — Telephone Encounter (Signed)
Can we attempt a PA for [place]? He has only been using Trulicity, however, his insurance approved in the past.

## 2023-04-02 ENCOUNTER — Other Ambulatory Visit: Payer: Self-pay

## 2023-04-02 LAB — CMP14+EGFR
ALT: 19 [IU]/L (ref 0–44)
AST: 21 [IU]/L (ref 0–40)
Albumin: 4.7 g/dL (ref 4.3–5.2)
Alkaline Phosphatase: 77 [IU]/L (ref 44–121)
BUN/Creatinine Ratio: 12 (ref 9–20)
BUN: 12 mg/dL (ref 6–20)
Bilirubin Total: 1 mg/dL (ref 0.0–1.2)
CO2: 23 mmol/L (ref 20–29)
Calcium: 9.6 mg/dL (ref 8.7–10.2)
Chloride: 103 mmol/L (ref 96–106)
Creatinine, Ser: 0.99 mg/dL (ref 0.76–1.27)
Globulin, Total: 1.8 g/dL (ref 1.5–4.5)
Glucose: 165 mg/dL — ABNORMAL HIGH (ref 70–99)
Potassium: 4.6 mmol/L (ref 3.5–5.2)
Sodium: 140 mmol/L (ref 134–144)
Total Protein: 6.5 g/dL (ref 6.0–8.5)
eGFR: 106 mL/min/{1.73_m2} (ref >59)

## 2023-04-02 LAB — LIPID PANEL
Chol/HDL Ratio: 2.8 {ratio} (ref 0.0–5.0)
Cholesterol, Total: 177 mg/dL (ref 100–199)
HDL: 64 mg/dL (ref >39)
LDL Chol Calc (NIH): 103 mg/dL — ABNORMAL HIGH (ref 0–99)
Triglycerides: 49 mg/dL (ref 0–149)
VLDL Cholesterol Cal: 10 mg/dL (ref 5–40)

## 2023-04-08 ENCOUNTER — Other Ambulatory Visit: Payer: Self-pay

## 2023-04-08 MED ORDER — DEXCOM G7 SENSOR MISC: 6 refills | Status: DC

## 2023-04-08 MED ORDER — DEXCOM G7 RECEIVER DEVI: 0 refills | Status: DC

## 2023-04-08 NOTE — Addendum Note (Signed)
Addended by: Lois Huxley, Jeannett Senior L on: 04/08/2023 10:52 AM   Modules accepted: Orders

## 2023-04-09 ENCOUNTER — Telehealth: Payer: Self-pay | Admitting: Behavioral Health

## 2023-04-09 ENCOUNTER — Other Ambulatory Visit: Payer: Self-pay

## 2023-04-09 DIAGNOSIS — F902 Attention-deficit hyperactivity disorder, combined type: Secondary | ICD-10-CM

## 2023-04-09 NOTE — Telephone Encounter (Signed)
Pt called and said that he needs a refill on his focalin 40 mg. [facility] [address]

## 2023-04-09 NOTE — Telephone Encounter (Signed)
Pended [facility] [address]

## 2023-04-10 MED ORDER — DEXMETHYLPHENIDATE HCL ER 40 MG PO CP24: 40.0000 mg | ORAL_CAPSULE | Freq: Every day | ORAL | 0 refills | Status: DC

## 2023-04-19 ENCOUNTER — Encounter: Payer: Self-pay | Admitting: Behavioral Health

## 2023-04-19 ENCOUNTER — Ambulatory Visit: Payer: BC Managed Care – PPO | Admitting: Behavioral Health

## 2023-04-19 DIAGNOSIS — F250 Schizoaffective disorder, bipolar type: Secondary | ICD-10-CM | POA: Diagnosis not present

## 2023-04-19 DIAGNOSIS — F902 Attention-deficit hyperactivity disorder, combined type: Secondary | ICD-10-CM

## 2023-04-19 NOTE — Progress Notes (Signed)
[facility]  Patient ID: Jared Potts,  MRN: [id number]  PCP: Georganna Skeans, MD  Date of Evaluation: 04/19/2023 Time spent:30 minutes  Chief Complaint:  Chief Complaint   Anxiety; Depression; Follow-up; Patient Education; Medication Refill     HISTORY/CURRENT STATUS: HPI 27 year old male presents to this office for follow up and medication management. Very pleasant and talkative as usual today.  Says he has been very stable lately but has lost a lot of weight. He is concerned about medication causing low BS and increasing irritability and hallucinations.  His meter showed BS today at 118.  Still working two part-time jobs.  He is requesting no medication changes now .  Starting to try to eat some breakfast when taking his meds. He is diabetic.  hE He sleeps 7 plus hours per night. No mania, no psychosis, No SI/HI.   Individual Medical History/ Review of Systems: Changes? :No   Allergies: Citrus  Current Medications:  Current Outpatient Medications:    amoxicillin (AMOXIL) 500 MG tablet, Take 500 mg by mouth 3 (three) times daily., Disp: , Rfl:    blood glucose meter kit and supplies KIT, *ONETOUCH ULTRA* Use to test blood sugar daily as needed, Disp: 1 each, Rfl: 0   Blood Glucose Monitoring Suppl (CONTOUR NEXT LINK) w/Device KIT, USE TO TEST BLOOD SUGAR DAILY AS NEEDED, Disp: 1 kit, Rfl: 0   Brexpiprazole (REXULTI) 3 MG TABS, Take 1 tablet (3 mg total) by mouth daily after breakfast., Disp: 90 tablet, Rfl: 3   Continuous Glucose Receiver (DEXCOM G7 RECEIVER) DEVI, Use to check blood glucose continuously., Disp: 1 each, Rfl: 0   Continuous Glucose Sensor (DEXCOM G7 SENSOR) MISC, Use to check blood glucose throughout the day. Changes sensors once every 10 days., Disp: 3 each, Rfl: 6   cyclobenzaprine (FLEXERIL) 10 MG tablet, Take 1 tablet (10 mg total) by mouth at bedtime., Disp: 10 tablet, Rfl: 0   Dexmethylphenidate HCl 40 MG CP24, Take 1 capsule (40 mg total) by  mouth daily., Disp: 30 capsule, Rfl: 0   Dulaglutide (TRULICITY) 3 MG/0.5ML SOAJ, as directed Subcutaneous, Disp: 6 mL, Rfl: 1   glucose blood test strip, Use as instructed, Disp: 100 each, Rfl: 12   metFORMIN (GLUCOPHAGE) 1000 MG tablet, TAKE 1 TABLET (1,000 MG TOTAL) BY MOUTH TWICE A DAY WITH FOOD, Disp: 180 tablet, Rfl: 1   methylphenidate (CONCERTA) 36 MG PO CR tablet, Take 1 tablet (36 mg total) by mouth daily., Disp: 30 tablet, Rfl: 0   Microlet Lancets MISC, 1 application by Does not apply route daily as needed., Disp: 90 each, Rfl: 0   naproxen (NAPROSYN) 500 MG tablet, Take 1 tablet (500 mg total) by mouth 2 (two) times daily., Disp: 30 tablet, Rfl: 0   naproxen (NAPROSYN) 500 MG tablet, Take 1 tablet by mouth 2 (two) times daily as needed., Disp: , Rfl:    naproxen sodium (ANAPROX DS) 550 MG tablet, Take 1 tablet (550 mg total) by mouth 2 (two) times daily with a meal., Disp: 30 tablet, Rfl: 0 Medication Side Effects: none  Family Medical/ Social History: Changes? No  MENTAL HEALTH EXAM:  There were no vitals taken for this visit.There is no height or weight on file to calculate BMI.  General Appearance: Casual, Neat, and Well Groomed  Eye Contact:  Good  Speech:  Clear and Coherent  Volume:  Normal  Mood:  Anxious, Depressed, and Dysphoric  Affect:  Depressed  Thought Process:  Coherent  Orientation:  Full (Time, Place, and Person)  Thought Content: Logical   Suicidal Thoughts:  No  Homicidal Thoughts:  No  Memory:  WNL  Judgement:  Good  Insight:  Good  Psychomotor Activity:  Normal  Concentration:  Concentration: Good  Recall:  Good  Fund of Knowledge: Good  Language: Good  Assets:  Desire for Improvement  ADL's:  Intact  Cognition: WNL  Prognosis:  Good    DIAGNOSES: No diagnosis found.  Receiving Psychotherapy: No    RECOMMENDATIONS:   Greater than 50% of 30 min. face to face time with patient was spent on counseling and coordination of care. Concerned  about low BS caused by his psych medication. I told him that I did not believe his Methylphenidate or Rexulti would cause decreased BS after the amount of time he has been taking the medication. He has experimented with different supplements such as sea moss. I encouraged him to speak to pharmacist about supplements and his PCP. He now says he is compliant with taking his Dx medication. We talked about his continued psychiatric stability with Rexulti. Consistently working. Active social life. Patient did not want to make medication changes at this time. Counseled patient on making sure he calls one week prior to running out of medications to avoid relapse.   Continue Rexulti 3 mg daily Continue Dexmethylphenidate 40 mg daily after breakfast Will report any side effect or worsening symptoms He is now doing daily meditation with crystals and would like to consider mindfulness and yoga. To follow up in 2 months per pt due to financial concerns Aims score=0  04/19/2023 Reviewed PDMP Discussed potential benefits, risks, and side effects of stimulants with patient to include increased heart rate, palpitations, insomnia, increased anxiety, increased irritability, or decreased appetite.  Instructed patient to contact office if experiencing any significant tolerability issues.      Joan Flores, NP

## 2023-05-15 ENCOUNTER — Other Ambulatory Visit: Payer: Self-pay

## 2023-05-15 ENCOUNTER — Telehealth: Payer: Self-pay | Admitting: Behavioral Health

## 2023-05-15 DIAGNOSIS — F902 Attention-deficit hyperactivity disorder, combined type: Secondary | ICD-10-CM

## 2023-05-15 MED ORDER — DEXMETHYLPHENIDATE HCL ER 40 MG PO CP24: 40.0000 mg | ORAL_CAPSULE | Freq: Every day | ORAL | 0 refills | Status: DC

## 2023-05-15 NOTE — Telephone Encounter (Signed)
 Pt called to request refill of Dexmethylphenidate HCL 40mg  to   [id number], [place] - [address] [place] [address] Phone: [phone]  Fax: [phone]   Next appt 1/22

## 2023-05-15 NOTE — Telephone Encounter (Signed)
 [facility]

## 2023-05-29 ENCOUNTER — Ambulatory Visit: Payer: BC Managed Care – PPO | Admitting: Skilled Nursing Facility1

## 2023-05-29 ENCOUNTER — Ambulatory Visit: Payer: BC Managed Care – PPO | Admitting: Behavioral Health

## 2023-05-29 ENCOUNTER — Encounter: Payer: Self-pay | Admitting: Behavioral Health

## 2023-05-29 DIAGNOSIS — F902 Attention-deficit hyperactivity disorder, combined type: Secondary | ICD-10-CM

## 2023-05-29 DIAGNOSIS — F250 Schizoaffective disorder, bipolar type: Secondary | ICD-10-CM | POA: Diagnosis not present

## 2023-05-29 MED ORDER — REXULTI 3 MG PO TABS: 3.0000 mg | ORAL_TABLET | Freq: Every day | ORAL | 3 refills | Status: DC

## 2023-05-29 NOTE — Progress Notes (Signed)
[facility]  Patient ID: SAMIUL ACREY,  MRN: [id number]  PCP: Georganna Skeans, MD  Date of Evaluation: 05/29/2023 Time spent:30 minutes  Chief Complaint:  Chief Complaint   Anxiety; Depression; Follow-up; Medication Refill; Patient Education     HISTORY/CURRENT STATUS: HPI 28 year old male presents to this office for follow up and medication management. Very pleasant and talkative as usual today.  He is smiling and joking.  Says he is currently feeling really good. Taking new supplement Shilajit that says he feels better.   Still working two part-time jobs.  He is requesting no medication changes now .  Starting to try to eat some breakfast when taking his meds. He is diabetic. He  He sleeps 7 plus hours per night. No mania, no psychosis, No SI/HI.   Individual Medical History/ Review of Systems: Changes? :No   Allergies: Citrus  Current Medications:  Current Outpatient Medications:    amoxicillin (AMOXIL) 500 MG tablet, Take 500 mg by mouth 3 (three) times daily., Disp: , Rfl:    blood glucose meter kit and supplies KIT, *ONETOUCH ULTRA* Use to test blood sugar daily as needed, Disp: 1 each, Rfl: 0   Blood Glucose Monitoring Suppl (CONTOUR NEXT LINK) w/Device KIT, USE TO TEST BLOOD SUGAR DAILY AS NEEDED, Disp: 1 kit, Rfl: 0   Brexpiprazole (REXULTI) 3 MG TABS, Take 1 tablet (3 mg total) by mouth daily after breakfast., Disp: 90 tablet, Rfl: 3   Continuous Glucose Receiver (DEXCOM G7 RECEIVER) DEVI, Use to check blood glucose continuously., Disp: 1 each, Rfl: 0   Continuous Glucose Sensor (DEXCOM G7 SENSOR) MISC, Use to check blood glucose throughout the day. Changes sensors once every 10 days., Disp: 3 each, Rfl: 6   cyclobenzaprine (FLEXERIL) 10 MG tablet, Take 1 tablet (10 mg total) by mouth at bedtime., Disp: 10 tablet, Rfl: 0   Dexmethylphenidate HCl 40 MG CP24, Take 1 capsule (40 mg total) by mouth daily., Disp: 30 capsule, Rfl: 0   Dulaglutide (TRULICITY) 3  MG/0.5ML SOAJ, as directed Subcutaneous, Disp: 6 mL, Rfl: 1   glucose blood test strip, Use as instructed, Disp: 100 each, Rfl: 12   metFORMIN (GLUCOPHAGE) 1000 MG tablet, TAKE 1 TABLET (1,000 MG TOTAL) BY MOUTH TWICE A DAY WITH FOOD, Disp: 180 tablet, Rfl: 1   methylphenidate (CONCERTA) 36 MG PO CR tablet, Take 1 tablet (36 mg total) by mouth daily., Disp: 30 tablet, Rfl: 0   Microlet Lancets MISC, 1 application by Does not apply route daily as needed., Disp: 90 each, Rfl: 0   naproxen (NAPROSYN) 500 MG tablet, Take 1 tablet (500 mg total) by mouth 2 (two) times daily., Disp: 30 tablet, Rfl: 0   naproxen (NAPROSYN) 500 MG tablet, Take 1 tablet by mouth 2 (two) times daily as needed., Disp: , Rfl:    naproxen sodium (ANAPROX DS) 550 MG tablet, Take 1 tablet (550 mg total) by mouth 2 (two) times daily with a meal., Disp: 30 tablet, Rfl: 0 Medication Side Effects: none  Family Medical/ Social History: Changes? No  MENTAL HEALTH EXAM:  There were no vitals taken for this visit.There is no height or weight on file to calculate BMI.  General Appearance: Casual, Neat, and Well Groomed  Eye Contact:  Good  Speech:  Clear and Coherent  Volume:  Normal  Mood:  NA  Affect:  Appropriate  Thought Process:  Coherent  Orientation:  Full (Time, Place, and Person)  Thought Content: Logical   Suicidal Thoughts:  No  Homicidal Thoughts:  No  Memory:  WNL  Judgement:  Good  Insight:  Good  Psychomotor Activity:  Normal  Concentration:  Concentration: Good  Recall:  Good  Fund of Knowledge: Good  Language: Good  Assets:  Desire for Improvement  ADL's:  Intact  Cognition: WNL  Prognosis:  Good    DIAGNOSES:    ICD-10-CM   1. Schizoaffective disorder, bipolar type (HCC)  F25.0     2. Attention deficit hyperactivity disorder (ADHD), combined type  F90.2 Brexpiprazole (REXULTI) 3 MG TABS    3. Attention deficit hyperactivity disorder (ADHD), combined type, moderate  F90.2       Receiving  Psychotherapy: No    RECOMMENDATIONS:   Greater than 50% of 30 min. face to face time with patient was spent on counseling and coordination of care. We talked about his current stability. He is smiling today and says he feels good. He has experimented with different supplements such as sea moss and shilajit.  I encouraged him to speak to pharmacist about supplements and his PCP. He now says he is compliant with taking his Dx medication. We talked about his continued psychiatric stability with Rexulti. Consistently working. Active social life. Patient did not want to make medication changes at this time. Counseled patient on making sure he calls one week prior to running out of medications to avoid relapse.   Continue Rexulti 3 mg daily Continue Dexmethylphenidate 40 mg daily after breakfast Will report any side effect or worsening symptoms He is now doing daily meditation with crystals and would like to consider mindfulness and yoga. To follow up in 2 months per pt due to financial concerns Aims score=0  05/28/2022 Reviewed PDMP Discussed potential benefits, risks, and side effects of stimulants with patient to include increased heart rate, palpitations, insomnia, increased anxiety, increased irritability, or decreased appetite.  Instructed patient to contact office if experiencing any significant tolerability issues.      Joan Flores, NP

## 2023-06-18 ENCOUNTER — Telehealth: Payer: Self-pay | Admitting: Family Medicine

## 2023-06-18 NOTE — Telephone Encounter (Signed)
Patient was identified as falling into the [place] Measure - Diabetes.   Patient was: Appointment scheduled for lab or office visit for A1c.   Patient had lat A1C check on 03/06/2023 patient is scheduled for NEW PT appointment with diabetes management nutritionist.

## 2023-06-19 ENCOUNTER — Other Ambulatory Visit: Payer: Self-pay

## 2023-06-19 DIAGNOSIS — F902 Attention-deficit hyperactivity disorder, combined type: Secondary | ICD-10-CM

## 2023-06-19 MED ORDER — DEXMETHYLPHENIDATE HCL ER 40 MG PO CP24: 40.0000 mg | ORAL_CAPSULE | Freq: Every day | ORAL | 0 refills | Status: DC

## 2023-07-03 ENCOUNTER — Encounter: Payer: BC Managed Care – PPO | Attending: Family Medicine | Admitting: Skilled Nursing Facility1

## 2023-07-03 ENCOUNTER — Encounter: Payer: Self-pay | Admitting: Skilled Nursing Facility1

## 2023-07-03 DIAGNOSIS — Z713 Dietary counseling and surveillance: Secondary | ICD-10-CM | POA: Diagnosis present

## 2023-07-03 DIAGNOSIS — E1165 Type 2 diabetes mellitus with hyperglycemia: Secondary | ICD-10-CM | POA: Diagnosis not present

## 2023-07-03 NOTE — Progress Notes (Unsigned)
 DM medications: Metformin Trulicity   Concise/simple education required and followed well.   Pt states he wants to know what he can eat to build muscle and still control his blood sugars,Smoothies: whey protein, banana, oat spinach, fruit, 2 eggs, peanut butter honey.   Workout routine: 5 days a week: weights, limits cardio so he does not lose muscle mass. 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  Pt states he goes to his moms house to Twice a week for a meal. Pt states he has not been wearing his CGM stating he needs a refill.   Body Composition Scale 07/03/2023  Current Body Weight 236.8  Total Body Fat % 24.5  Visceral Fat 13  Fat-Free Mass % 75.4   Total Body Water % 56.4  Muscle-Mass lbs 44.6  BMI 30.7  Body Fat Displacement          Torso  lbs 36         Left Leg  lbs 7.2         Right Leg  lbs 7.2         Left Arm  lbs 3.6         Right Arm   lbs 3.6    24 hr recall: Wakes around 8-9am First meal: jimmy deans breakfast bowl or oatmeal + grits + eggs + protein shake  Snack: Second meal 3-4pm: [employer] chunky chili mac  Snack: sometimes chips Third meal 7-8pm: 2 salmon fillets + honey + seasoning + ramen noodles  Snack:  Beverages: juice, water, occasionally beer, occasionally soda   Goals: I will call my doctor and asked for a foot doctor referral  Call your doctor and ask about your ED in regards to your ADHD medicine [email or web address]  Diabetes Self-Management Education  Visit Type: First/Initial  Appt. Start Time: 1:55 Appt. End Time: 3:05  07/04/2023  Jared Potts, identified by name and date of birth, is a 28 y.o. male with a diagnosis of Diabetes: Type 2.   ASSESSMENT  Height 6\' 1"  (1.854 m), weight 236 lb 12.8 oz (107.4 kg). Body mass index is 31.24 kg/m.   Diabetes  Self-Management Education - 07/04/23 1618       Visit Information   Visit Type First/Initial      Initial Visit   Diabetes Type Type 2    Are you currently following a meal plan? No    Are you taking your medications as prescribed? Yes      Health Coping   How would you rate your overall health? Good      Psychosocial Assessment   Patient Belief/Attitude about Diabetes Motivated to manage diabetes    What is the hardest part about your diabetes right now, causing you the most concern, or is the most worrisome to you about your diabetes?   Making healty food and beverage choices    Self-care barriers Debilitated state due to current medical condition    Self-management support Family    Patient Concerns Nutrition/Meal planning;Weight Control    Special Needs Simplified materials    Preferred Learning Style Visual;Hands on    Learning Readiness Ready    How often do you need to have someone help you when you read instructions, pamphlets, or other written materials from your doctor or pharmacy? 5 - Always      Pre-Education Assessment  Patient understands the diabetes disease and treatment process. Needs Instruction    Patient understands incorporating nutritional management into lifestyle. Needs Instruction    Patient undertands incorporating physical activity into lifestyle. Needs Instruction    Patient understands using medications safely. Needs Instruction    Patient understands monitoring blood glucose, interpreting and using results Needs Instruction    Patient understands prevention, detection, and treatment of acute complications. Needs Instruction    Patient understands prevention, detection, and treatment of chronic complications. Needs Instruction    Patient understands how to develop strategies to address psychosocial issues. Needs Instruction    Patient understands how to develop strategies to promote health/change behavior. Needs Instruction      Complications   Last  HgB A1C per patient/outside source 14.3 %    How often do you check your blood sugar? 0 times/day (not testing)      Activity / Exercise   Activity / Exercise Type Moderate (swimming / aerobic walking)    How many days per week do you exercise? 5    How many minutes per day do you exercise? 30    Total minutes per week of exercise 150      Patient Education   Previous Diabetes Education No    Healthy Eating Role of diet in the treatment of diabetes and the relationship between the three main macronutrients and blood glucose level;Food label reading, portion sizes and measuring food.;Plate Method;Meal options for control of blood glucose level and chronic complications.      Post-Education Assessment   Patient understands the diabetes disease and treatment process. Comprehends key points    Patient understands incorporating nutritional management into lifestyle. Comprehends key points    Patient undertands incorporating physical activity into lifestyle. Comprehends key points    Patient understands using medications safely. Comphrehends key points    Patient understands monitoring blood glucose, interpreting and using results Comprehends key points    Patient understands prevention, detection, and treatment of acute complications. Comprehends key points    Patient understands prevention, detection, and treatment of chronic complications. Comprehends key points    Patient understands how to develop strategies to address psychosocial issues. Comprehends key points    Patient understands how to develop strategies to promote health/change behavior. Comprehends key points      Outcomes   Expected Outcomes Demonstrated interest in learning but significant barriers to change    Future DMSE 4-6 wks    Program Status Completed             Individualized Plan for Diabetes Self-Management Training:   Learning Objective:  Patient will have a greater understanding of diabetes  self-management. Patient education plan is to attend individual and/or group sessions per assessed needs and concerns.    Expected Outcomes:  Demonstrated interest in learning but significant barriers to change  Education material provided: My Plate  If problems or questions, patient to contact team via:  Phone and Email  Future DSME appointment: 4-6 wks

## 2023-07-29 ENCOUNTER — Telehealth: Payer: Self-pay | Admitting: Behavioral Health

## 2023-07-29 ENCOUNTER — Other Ambulatory Visit: Payer: Self-pay

## 2023-07-29 DIAGNOSIS — F902 Attention-deficit hyperactivity disorder, combined type: Secondary | ICD-10-CM

## 2023-07-29 MED ORDER — DEXMETHYLPHENIDATE HCL ER 40 MG PO CP24: 40.0000 mg | ORAL_CAPSULE | Freq: Every day | ORAL | 0 refills | Status: DC

## 2023-07-29 NOTE — Telephone Encounter (Signed)
 Pended Focalin 40 mg

## 2023-07-29 NOTE — Telephone Encounter (Signed)
 Pt called asking for a refill on his focalin 40 mg. [facility] [address] in [place].Next appt 4/25

## 2023-08-30 ENCOUNTER — Ambulatory Visit: Payer: BC Managed Care – PPO | Admitting: Behavioral Health

## 2023-08-30 ENCOUNTER — Encounter: Payer: Self-pay | Admitting: Behavioral Health

## 2023-08-30 DIAGNOSIS — F902 Attention-deficit hyperactivity disorder, combined type: Secondary | ICD-10-CM

## 2023-08-30 MED ORDER — DEXMETHYLPHENIDATE HCL ER 40 MG PO CP24: 40.0000 mg | ORAL_CAPSULE | Freq: Every day | ORAL | 0 refills | Status: DC

## 2023-08-30 MED ORDER — REXULTI 3 MG PO TABS: 3.0000 mg | ORAL_TABLET | Freq: Every day | ORAL | 3 refills | Status: DC

## 2023-08-30 NOTE — Progress Notes (Signed)
 [facility]  Patient ID: Jared Potts,  MRN: [id number]  PCP: Abraham Abo, MD  Date of Evaluation: 08/30/2023 Time spent:30 minutes  Chief Complaint:  Chief Complaint   Depression; Anxiety; ADHD; Follow-up; Medication Refill; Patient Education     HISTORY/CURRENT STATUS: HPI 28 year old male presents to this office for follow up and medication management. Very pleasant and talkative as usual today.  Says he has been very stable lately. Still working out daily. Happy with his current medication regimen. He quit his [employer] job and now looking for another job.  He is requesting no medication changes now .  Starting to try to eat some breakfast when taking his meds. He is diabetic.  hE He sleeps 7 plus hours per night. No mania, no psychosis, No SI/HI.       Individual Medical History/ Review of Systems: Changes? :No   Allergies: Citrus  Current Medications:  Current Outpatient Medications:    amoxicillin (AMOXIL) 500 MG tablet, Take 500 mg by mouth 3 (three) times daily., Disp: , Rfl:    blood glucose meter kit and supplies KIT, *ONETOUCH ULTRA* Use to test blood sugar daily as needed, Disp: 1 each, Rfl: 0   Blood Glucose Monitoring Suppl (CONTOUR NEXT LINK) w/Device KIT, USE TO TEST BLOOD SUGAR DAILY AS NEEDED, Disp: 1 kit, Rfl: 0   Brexpiprazole  (REXULTI ) 3 MG TABS, Take 1 tablet (3 mg total) by mouth daily after breakfast., Disp: 90 tablet, Rfl: 3   Continuous Glucose Receiver (DEXCOM G7 RECEIVER) DEVI, Use to check blood glucose continuously., Disp: 1 each, Rfl: 0   Continuous Glucose Sensor (DEXCOM G7 SENSOR) MISC, Use to check blood glucose throughout the day. Changes sensors once every 10 days., Disp: 3 each, Rfl: 6   cyclobenzaprine  (FLEXERIL ) 10 MG tablet, Take 1 tablet (10 mg total) by mouth at bedtime., Disp: 10 tablet, Rfl: 0   Dexmethylphenidate  HCl 40 MG CP24, Take 1 capsule (40 mg total) by mouth daily., Disp: 30 capsule, Rfl: 0   Dulaglutide   (TRULICITY ) 3 MG/0.5ML SOAJ, as directed Subcutaneous, Disp: 6 mL, Rfl: 1   glucose blood test strip, Use as instructed, Disp: 100 each, Rfl: 12   metFORMIN  (GLUCOPHAGE ) 1000 MG tablet, TAKE 1 TABLET (1,000 MG TOTAL) BY MOUTH TWICE A DAY WITH FOOD, Disp: 180 tablet, Rfl: 1   Microlet Lancets MISC, 1 application by Does not apply route daily as needed., Disp: 90 each, Rfl: 0   naproxen  (NAPROSYN ) 500 MG tablet, Take 1 tablet (500 mg total) by mouth 2 (two) times daily., Disp: 30 tablet, Rfl: 0   naproxen  (NAPROSYN ) 500 MG tablet, Take 1 tablet by mouth 2 (two) times daily as needed., Disp: , Rfl:    naproxen  sodium (ANAPROX  DS) 550 MG tablet, Take 1 tablet (550 mg total) by mouth 2 (two) times daily with a meal., Disp: 30 tablet, Rfl: 0 Medication Side Effects: none  Family Medical/ Social History: Changes? No  MENTAL HEALTH EXAM:  There were no vitals taken for this visit.There is no height or weight on file to calculate BMI.  General Appearance: Casual, Neat, and Well Groomed  Eye Contact:  Good  Speech:  Clear and Coherent  Volume:  Normal  Mood:  NA  Affect:  Appropriate  Thought Process:  Coherent  Orientation:  Full (Time, Place, and Person)  Thought Content: Logical   Suicidal Thoughts:  No  Homicidal Thoughts:  No  Memory:  WNL  Judgement:  Good  Insight:  Good  Psychomotor Activity:  Normal  Concentration:  Concentration: Good  Recall:  Good  Fund of Knowledge: Fair  Language: Fair  Assets:  Desire for Improvement  ADL's:  Intact  Cognition: WNL  Prognosis:  Fair    DIAGNOSES:    ICD-10-CM   1. Attention deficit hyperactivity disorder (ADHD), combined type  F90.2 Brexpiprazole  (REXULTI ) 3 MG TABS    Dexmethylphenidate  HCl 40 MG CP24      Receiving Psychotherapy: No    RECOMMENDATIONS:   Greater than 50% of 30 min. face to face time with patient was spent on counseling and coordination of care. Discussed his current level of stability. He is happy with his  medications right now.  We talked about his continued psychiatric stability with Rexulti . Talked about him quitting his [employer] job. 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 He is remaining positive and actively search for new job.  Active social life. Patient did not want to make medication changes at this time. Counseled patient on making sure he calls one week prior to running out of medications to avoid relapse.   Continue Rexulti  3 mg daily Continue Dexmethylphenidate  40 mg daily after breakfast Will report any side effect or worsening symptoms He is now doing daily meditation with crystals and would like to consider mindfulness and yoga. To follow up in 3 months per pt due to financial concerns Aims score=0  08/30/2023 Reviewed PDMP Discussed potential benefits, risks, and side effects of stimulants with patient to include increased heart rate, palpitations, insomnia, increased anxiety, increased irritability, or decreased appetite.  Instructed patient to contact office if experiencing any significant tolerability issues.    Lincoln Renshaw, NP

## 2023-09-03 ENCOUNTER — Ambulatory Visit: Payer: BC Managed Care – PPO | Admitting: Skilled Nursing Facility1

## 2023-09-25 ENCOUNTER — Encounter: Payer: Self-pay | Admitting: Family Medicine

## 2023-09-25 ENCOUNTER — Ambulatory Visit (INDEPENDENT_AMBULATORY_CARE_PROVIDER_SITE_OTHER): Admitting: Family Medicine

## 2023-09-25 VITALS — BP 109/73 | HR 87 | Temp 98.4°F | Resp 16 | Ht 76.5 in | Wt 242.0 lb

## 2023-09-25 DIAGNOSIS — E119 Type 2 diabetes mellitus without complications: Secondary | ICD-10-CM

## 2023-09-25 DIAGNOSIS — E11649 Type 2 diabetes mellitus with hypoglycemia without coma: Secondary | ICD-10-CM

## 2023-09-25 DIAGNOSIS — Z7984 Long term (current) use of oral hypoglycemic drugs: Secondary | ICD-10-CM | POA: Diagnosis not present

## 2023-09-25 DIAGNOSIS — Z7985 Long-term (current) use of injectable non-insulin antidiabetic drugs: Secondary | ICD-10-CM | POA: Diagnosis not present

## 2023-09-25 MED ORDER — GABAPENTIN 100 MG PO CAPS: 100.0000 mg | ORAL_CAPSULE | Freq: Every day | ORAL | 0 refills | Status: AC

## 2023-09-25 MED ORDER — DEXCOM G7 RECEIVER DEVI
0 refills | Status: AC
Start: 2023-09-25 — End: ?

## 2023-09-25 MED ORDER — DEXCOM G7 SENSOR MISC: 6 refills | Status: AC

## 2023-09-25 NOTE — Progress Notes (Unsigned)
Patient is here for their  follow-up Patient has no concerns today Care gaps have been discussed with patient  

## 2023-09-27 ENCOUNTER — Encounter: Payer: Self-pay | Admitting: Family Medicine

## 2023-09-27 NOTE — Progress Notes (Unsigned)
 Established Patient Office Visit  Subjective    Patient ID: Jared Potts, male    DOB: December 24, 1995  Age: 28 y.o. MRN: [id number]  CC:  Chief Complaint  Patient presents with   Medical Management of Chronic Issues    HPI Jared Potts presents for follow up of diabetes. Patient denies acute complaints.   Outpatient Encounter Medications as of 09/25/2023  Medication Sig   amoxicillin (AMOXIL) 500 MG tablet Take 500 mg by mouth 3 (three) times daily.   blood glucose meter kit and supplies KIT *ONETOUCH ULTRA* Use to test blood sugar daily as needed   Blood Glucose Monitoring Suppl (CONTOUR NEXT LINK) w/Device KIT USE TO TEST BLOOD SUGAR DAILY AS NEEDED   Brexpiprazole  (REXULTI ) 3 MG TABS Take 1 tablet (3 mg total) by mouth daily after breakfast.   cyclobenzaprine  (FLEXERIL ) 10 MG tablet Take 1 tablet (10 mg total) by mouth at bedtime.   Dexmethylphenidate  HCl 40 MG CP24 Take 1 capsule (40 mg total) by mouth daily.   Dulaglutide  (TRULICITY ) 3 MG/0.5ML SOAJ as directed Subcutaneous   gabapentin (NEURONTIN) 100 MG capsule Take 1 capsule (100 mg total) by mouth at bedtime.   glucose blood test strip Use as instructed   metFORMIN  (GLUCOPHAGE ) 1000 MG tablet TAKE 1 TABLET (1,000 MG TOTAL) BY MOUTH TWICE A DAY WITH FOOD   Microlet Lancets MISC 1 application by Does not apply route daily as needed.   naproxen  (NAPROSYN ) 500 MG tablet Take 1 tablet (500 mg total) by mouth 2 (two) times daily.   naproxen  (NAPROSYN ) 500 MG tablet Take 1 tablet by mouth 2 (two) times daily as needed.   naproxen  sodium (ANAPROX  DS) 550 MG tablet Take 1 tablet (550 mg total) by mouth 2 (two) times daily with a meal.   [DISCONTINUED] Continuous Glucose Receiver (DEXCOM G7 RECEIVER) DEVI Use to check blood glucose continuously.   [DISCONTINUED] Continuous Glucose Sensor (DEXCOM G7 SENSOR) MISC Use to check blood glucose throughout the day. Changes sensors once every 10 days.   Continuous Glucose Receiver (DEXCOM  G7 RECEIVER) DEVI Use to check blood glucose continuously.   Continuous Glucose Sensor (DEXCOM G7 SENSOR) MISC Use to check blood glucose throughout the day. Changes sensors once every 10 days.   No facility-administered encounter medications on file as of 09/25/2023.    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)    Thyroiditis, autoimmune    Type 2 diabetes mellitus in patient age 4-19 years with HbA1C goal below 7.5     Past Surgical History:  Procedure Laterality Date   CIRCUMCISION      Family History  Problem Relation Age of Onset   Obesity Mother    Diabetes Maternal Aunt    Obesity Maternal Aunt    Diabetes Maternal Grandmother    Obesity Maternal Grandmother    Hypertension Father    Cancer Neg Hx     Social History   Socioeconomic History   Marital status: Single    Spouse name: Not on file   Number of children: Not on file   Years of education: Not on file   Highest education level: Not on file  Occupational History   Not on file  Tobacco Use  Smoking status: Never    Passive exposure: Never   Smokeless tobacco: Never  Vaping Use   Vaping status: Never Used  Substance and Sexual Activity   Alcohol use: Yes    Comment: occasionally   Drug use: No   Sexual activity: Not Currently  Other Topics Concern   Not on file  Social History Narrative   Lives with mom and dad and brother. Likes to make music, hip-hop and rap. Participates in recording.   Social Drivers of Health   Financial Resource Strain: Medium Risk (03/06/2023)   Overall Financial Resource Strain (CARDIA)    Difficulty of Paying Living Expenses: Somewhat hard  Food Insecurity: Food Insecurity Present (03/06/2023)   Hunger Vital Sign    Worried About Running Out of  Food in the Last Year: Sometimes true    Ran Out of Food in the Last Year: Sometimes true  Transportation Needs: No Transportation Needs (03/06/2023)   PRAPARE - Administrator, Civil Service (Medical): No    Lack of Transportation (Non-Medical): No  Physical Activity: Sufficiently Active (03/06/2023)   Exercise Vital Sign    Days of Exercise per Week: 7 days    Minutes of Exercise per Session: 30 min  Stress: No Stress Concern Present (03/06/2023)   [employer] of Occupational Health - Occupational Stress Questionnaire    Feeling of Stress : Not at all  Social Connections: Moderately Isolated (03/06/2023)   Social Connection and Isolation Panel [NHANES]    Frequency of Communication with Friends and Family: More than three times a week    Frequency of Social Gatherings with Friends and Family: More than three times a week    Attends Religious Services: More than 4 times per year    Active Member of [employer] or Organizations: No    Attends Banker Meetings: Never    Marital Status: Never married  Intimate Partner Violence: Not At Risk (03/06/2023)   Humiliation, Afraid, Rape, and Kick questionnaire    Fear of Current or Ex-Partner: No    Emotionally Abused: No    Physically Abused: No    Sexually Abused: No    Review of Systems  All other systems reviewed and are negative.       Objective    BP 109/73   Pulse 87   Temp 98.4 F (36.9 C) (Oral)   Resp 16   Ht 6' 4.5" (1.943 m)   Wt 242 lb (109.8 kg)   SpO2 96%   BMI 29.07 kg/m   Physical Exam Vitals and nursing note reviewed.  Constitutional:      General: He is not in acute distress. Cardiovascular:     Rate and Rhythm: Normal rate and regular rhythm.  Pulmonary:     Effort: Pulmonary effort is normal.     Breath sounds: Normal breath sounds.  Abdominal:     Palpations: Abdomen is soft.     Tenderness: There is no abdominal tenderness.  Neurological:     General: No focal deficit  present.     Mental Status: He is alert and oriented to person, place, and time.  Psychiatric:        Mood and Affect: Mood normal.        Behavior: Behavior normal.     {Labs (Optional):23779}    Assessment & Plan:   Uncontrolled type 2 diabetes mellitus with hypoglycemia without coma (HCC) -     Dexcom G7 Receiver; Use to check blood glucose continuously.  Dispense: 1 each; Refill: 0 -     POCT glycosylated hemoglobin (Hb A1C)  Long-term current use of injectable noninsulin antidiabetic medication  Diabetes mellitus treated with oral medication (HCC)  Other orders -     Dexcom G7 Sensor; Use to check blood glucose throughout the day. Changes sensors once every 10 days.  Dispense: 3 each; Refill: 6 -     Gabapentin; Take 1 capsule (100 mg total) by mouth at bedtime.  Dispense: 90 capsule; Refill: 0     Return in about 3 months (around 12/26/2023) for chronic med issues, follow up.   Arlo Lama, MD

## 2023-10-07 ENCOUNTER — Other Ambulatory Visit: Payer: Self-pay

## 2023-10-07 ENCOUNTER — Telehealth: Payer: Self-pay | Admitting: Behavioral Health

## 2023-10-07 DIAGNOSIS — F902 Attention-deficit hyperactivity disorder, combined type: Secondary | ICD-10-CM

## 2023-10-07 MED ORDER — DEXMETHYLPHENIDATE HCL ER 40 MG PO CP24
40.0000 mg | ORAL_CAPSULE | Freq: Every day | ORAL | 0 refills | Status: DC
Start: 2023-11-04 — End: 2024-02-26

## 2023-10-07 MED ORDER — DEXMETHYLPHENIDATE HCL ER 40 MG PO CP24
40.0000 mg | ORAL_CAPSULE | Freq: Every day | ORAL | 0 refills | Status: DC
Start: 2023-10-07 — End: 2023-12-09

## 2023-10-07 NOTE — Telephone Encounter (Signed)
 Patient called in for refill Dexmethlphenidate 40mg . Ph: [phone] Appt 7/24 Pharmacy [facility] [address] [place], [place]

## 2023-10-07 NOTE — Telephone Encounter (Signed)
 Pended 2 RF for Dexmethlphenidate 40mg  to [facility] on [address]

## 2023-11-28 ENCOUNTER — Ambulatory Visit (INDEPENDENT_AMBULATORY_CARE_PROVIDER_SITE_OTHER): Payer: Self-pay | Admitting: Behavioral Health

## 2023-11-28 DIAGNOSIS — Z0389 Encounter for observation for other suspected diseases and conditions ruled out: Secondary | ICD-10-CM

## 2023-11-28 NOTE — Progress Notes (Signed)
 Pt did not show for scheduled appt and did not provide 24 hour notice as required. Additional fees to be assessed.

## 2023-12-09 ENCOUNTER — Other Ambulatory Visit: Payer: Self-pay

## 2023-12-09 ENCOUNTER — Telehealth: Payer: Self-pay | Admitting: Behavioral Health

## 2023-12-09 DIAGNOSIS — F902 Attention-deficit hyperactivity disorder, combined type: Secondary | ICD-10-CM

## 2023-12-09 MED ORDER — DEXMETHYLPHENIDATE HCL ER 40 MG PO CP24: 40.0000 mg | ORAL_CAPSULE | Freq: Every day | ORAL | 0 refills | Status: DC

## 2023-12-09 NOTE — Telephone Encounter (Signed)
 Patient called in for refill on Dextromethylphenidate 40mg . Ph: [phone] Appt 8/11 Pharmacy [facility] [address] [place]

## 2023-12-09 NOTE — Telephone Encounter (Signed)
 Pended.

## 2023-12-16 ENCOUNTER — Ambulatory Visit (INDEPENDENT_AMBULATORY_CARE_PROVIDER_SITE_OTHER): Payer: Self-pay | Admitting: Behavioral Health

## 2023-12-16 DIAGNOSIS — Z91199 Patient's noncompliance with other medical treatment and regimen due to unspecified reason: Secondary | ICD-10-CM

## 2023-12-16 NOTE — Progress Notes (Signed)
 Pt did not show for scheduled appt and did not provide 24 hour notice as required. Additional fees to be assessed.

## 2023-12-27 ENCOUNTER — Ambulatory Visit: Admitting: Family Medicine

## 2023-12-27 ENCOUNTER — Encounter: Payer: Self-pay | Admitting: Family Medicine

## 2023-12-27 VITALS — BP 111/74 | HR 73 | Temp 98.6°F | Resp 14 | Wt 248.0 lb

## 2023-12-27 DIAGNOSIS — E119 Type 2 diabetes mellitus without complications: Secondary | ICD-10-CM

## 2023-12-27 DIAGNOSIS — Z7985 Long-term (current) use of injectable non-insulin antidiabetic drugs: Secondary | ICD-10-CM

## 2023-12-27 DIAGNOSIS — Z7984 Long term (current) use of oral hypoglycemic drugs: Secondary | ICD-10-CM | POA: Diagnosis not present

## 2023-12-27 LAB — POCT GLYCOSYLATED HEMOGLOBIN (HGB A1C): HbA1c, POC (controlled diabetic range): 7.3 % — AB (ref 0.0–7.0)

## 2023-12-27 NOTE — Progress Notes (Signed)
 Established Patient Office Visit  Subjective    Patient ID: Jared Potts, male    DOB: 30-Mar-1996  Age: 28 y.o. MRN: [id number]  CC:  Chief Complaint  Patient presents with   Diabetes    HPI JAK HAGGAR presents for follow up of diabetes. Patient reports med compliance and denies acute complaints.   Outpatient Encounter Medications as of 12/27/2023  Medication Sig   amoxicillin (AMOXIL) 500 MG tablet Take 500 mg by mouth 3 (three) times daily.   blood glucose meter kit and supplies KIT *ONETOUCH ULTRA* Use to test blood sugar daily as needed   Blood Glucose Monitoring Suppl (CONTOUR NEXT LINK) w/Device KIT USE TO TEST BLOOD SUGAR DAILY AS NEEDED   Brexpiprazole  (REXULTI ) 3 MG TABS Take 1 tablet (3 mg total) by mouth daily after breakfast.   Continuous Glucose Receiver (DEXCOM G7 RECEIVER) DEVI Use to check blood glucose continuously.   Continuous Glucose Sensor (DEXCOM G7 SENSOR) MISC Use to check blood glucose throughout the day. Changes sensors once every 10 days.   cyclobenzaprine  (FLEXERIL ) 10 MG tablet Take 1 tablet (10 mg total) by mouth at bedtime.   Dexmethylphenidate  HCl 40 MG CP24 Take 1 capsule (40 mg total) by mouth daily.   Dexmethylphenidate  HCl 40 MG CP24 Take 1 capsule (40 mg total) by mouth daily.   Dulaglutide  (TRULICITY ) 3 MG/0.5ML SOAJ as directed Subcutaneous   gabapentin  (NEURONTIN ) 100 MG capsule Take 1 capsule (100 mg total) by mouth at bedtime.   glucose blood test strip Use as instructed   metFORMIN  (GLUCOPHAGE ) 1000 MG tablet TAKE 1 TABLET (1,000 MG TOTAL) BY MOUTH TWICE A DAY WITH FOOD   Microlet Lancets MISC 1 application by Does not apply route daily as needed.   naproxen  (NAPROSYN ) 500 MG tablet Take 1 tablet (500 mg total) by mouth 2 (two) times daily.   naproxen  (NAPROSYN ) 500 MG tablet Take 1 tablet by mouth 2 (two) times daily as needed.   naproxen  sodium (ANAPROX  DS) 550 MG tablet Take 1 tablet (550 mg total) by mouth 2 (two) times daily  with a meal.   No facility-administered encounter medications on file as of 12/27/2023.    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)    Thyroiditis, autoimmune    Type 2 diabetes mellitus in patient age 37-19 years with HbA1C goal below 7.5     Past Surgical History:  Procedure Laterality Date   CIRCUMCISION      Family History  Problem Relation Age of Onset   Obesity Mother    Diabetes Maternal Aunt    Obesity Maternal Aunt    Diabetes Maternal Grandmother    Obesity Maternal Grandmother    Hypertension Father    Cancer Neg Hx     Social History   Socioeconomic History   Marital status: Single    Spouse name: Not on file   Number of children: Not on file   Years of education: Not on file   Highest education level: Not on file  Occupational History   Not on file  Tobacco Use   Smoking status: Never    Passive exposure: Never   Smokeless tobacco: Never  Vaping Use   Vaping status: Never Used  Substance and Sexual Activity   Alcohol use: Yes    Comment: occasionally   Drug use: No   Sexual activity: Not Currently  Other Topics Concern   Not on file  Social History Narrative   Lives with mom and dad and brother. Likes to make music, hip-hop and rap. Participates in recording.   Social Drivers of Health   Financial Resource Strain: Medium Risk (03/06/2023)   Overall Financial Resource Strain (CARDIA)    Difficulty of Paying Living Expenses: Somewhat hard  Food Insecurity: Food Insecurity Present (03/06/2023)   Hunger Vital Sign    Worried About Running Out of Food in the Last Year: Sometimes true    Ran Out of Food in the Last Year: Sometimes true  Transportation Needs: No Transportation Needs (03/06/2023)   PRAPARE - Therapist, art (Medical): No    Lack of Transportation (Non-Medical): No  Physical Activity: Sufficiently Active (03/06/2023)   Exercise Vital Sign    Days of Exercise per Week: 7 days    Minutes of Exercise per Session: 30 min  Stress: No Stress Concern Present (03/06/2023)   [employer] of Occupational Health - Occupational Stress Questionnaire    Feeling of Stress : Not at all  Social Connections: Moderately Isolated (03/06/2023)   Social Connection and Isolation Panel    Frequency of Communication with Friends and Family: More than three times a week    Frequency of Social Gatherings with Friends and Family: More than three times a week    Attends Religious Services: More than 4 times per year    Active Member of [employer] or Organizations: No    Attends Banker Meetings: Never    Marital Status: Never married  Intimate Partner Violence: Not At Risk (03/06/2023)   Humiliation, Afraid, Rape, and Kick questionnaire    Fear of Current or Ex-Partner: No    Emotionally Abused: No    Physically Abused: No    Sexually Abused: No    Review of Systems  All other systems reviewed and are negative.       Objective    BP 111/74 (BP Location: Right Arm, Patient Position: Sitting)   Pulse 73   Temp 98.6 F (37 C) (Oral)   Resp 14   Wt 248 lb (112.5 kg)   SpO2 99%   BMI 29.79 kg/m   Physical Exam Vitals and nursing note reviewed.  Constitutional:      General: He is not in acute distress. Cardiovascular:     Rate and Rhythm: Normal rate and regular rhythm.  Pulmonary:     Effort: Pulmonary effort is normal.     Breath sounds: Normal breath sounds.  Abdominal:     Palpations: Abdomen is soft.     Tenderness: There is no abdominal tenderness.  Neurological:     General: No focal deficit present.     Mental Status: He is alert and oriented to person, place, and time.  Psychiatric:        Mood and Affect: Mood normal.        Behavior: Behavior  normal.         Assessment & Plan:   Type 2 diabetes mellitus without complication, without long-term current use of insulin  (HCC) -     POCT glycosylated hemoglobin (Hb A1C)  Long-term current use of injectable noninsulin antidiabetic medication  Diabetes mellitus treated with oral medication (HCC)   Mulch improved A1c and just above goal. Continue  Return in about 3 months (around 03/28/2024) for follow up.   Tanda Raguel SQUIBB, MD

## 2024-01-01 ENCOUNTER — Ambulatory Visit (INDEPENDENT_AMBULATORY_CARE_PROVIDER_SITE_OTHER): Admitting: Behavioral Health

## 2024-01-01 ENCOUNTER — Encounter: Payer: Self-pay | Admitting: Behavioral Health

## 2024-01-01 DIAGNOSIS — F902 Attention-deficit hyperactivity disorder, combined type: Secondary | ICD-10-CM | POA: Diagnosis not present

## 2024-01-01 DIAGNOSIS — F250 Schizoaffective disorder, bipolar type: Secondary | ICD-10-CM

## 2024-01-01 MED ORDER — DEXMETHYLPHENIDATE HCL ER 40 MG PO CP24: 40.0000 mg | ORAL_CAPSULE | Freq: Every day | ORAL | 0 refills | Status: DC

## 2024-01-01 NOTE — Progress Notes (Signed)
 [facility]  Patient ID: ALEXA GOLEBIEWSKI,  MRN: [id number]  PCP: Tanda Bleacher, MD  Date of Evaluation: 01/01/2024 Time spent:30 minutes  Chief Complaint:  Chief Complaint   Anxiety; Depression; ADHD; Medication Refill; Patient Education     HISTORY/CURRENT STATUS: HPI 28 year old male presents to this office for follow up and medication management. Very pleasant and talkative as usual today.  Says he has been very stable lately. He is excited about a pending beach trip for his birthday this weekend. Still working out daily. Happy with his current medication regimen.  He is requesting no medication changes now .  Starting to try to eat some breakfast when taking his meds. He is diabetic.  hE He sleeps 7 plus hours per night. No mania, no psychosis, No SI/HI.   Individual Medical History/ Review of Systems: Changes? :No   Allergies: Citrus 28 year old male presents to this office for follow up and medication management. Very pleasant and talkative as usual today.  Says he has been very stable lately. Still working out daily. Happy with his current medication regimen. He quit his [employer] job and now looking for another job.  He is requesting no medication changes now .  Starting to try to eat some breakfast when taking his meds. He is diabetic.  hE He sleeps 7 plus hours per night. No mania, no psychosis, No SI/HI.   Current Medications:  Current Outpatient Medications:    amoxicillin (AMOXIL) 500 MG tablet, Take 500 mg by mouth 3 (three) times daily., Disp: , Rfl:    blood glucose meter kit and supplies KIT, *ONETOUCH ULTRA* Use to test blood sugar daily as needed, Disp: 1 each, Rfl: 0   Blood Glucose Monitoring Suppl (CONTOUR NEXT LINK) w/Device KIT, USE TO TEST BLOOD SUGAR DAILY AS NEEDED, Disp: 1 kit, Rfl: 0   Brexpiprazole  (REXULTI ) 3 MG TABS, Take 1 tablet (3 mg total) by mouth daily after breakfast., Disp: 90 tablet, Rfl: 3   Continuous Glucose Receiver (DEXCOM G7  RECEIVER) DEVI, Use to check blood glucose continuously., Disp: 1 each, Rfl: 0   Continuous Glucose Sensor (DEXCOM G7 SENSOR) MISC, Use to check blood glucose throughout the day. Changes sensors once every 10 days., Disp: 3 each, Rfl: 6   cyclobenzaprine  (FLEXERIL ) 10 MG tablet, Take 1 tablet (10 mg total) by mouth at bedtime., Disp: 10 tablet, Rfl: 0   Dexmethylphenidate  HCl 40 MG CP24, Take 1 capsule (40 mg total) by mouth daily., Disp: 30 capsule, Rfl: 0   [START ON 01/08/2024] Dexmethylphenidate  HCl 40 MG CP24, Take 1 capsule (40 mg total) by mouth daily., Disp: 30 capsule, Rfl: 0   Dulaglutide  (TRULICITY ) 3 MG/0.5ML SOAJ, as directed Subcutaneous, Disp: 6 mL, Rfl: 1   gabapentin  (NEURONTIN ) 100 MG capsule, Take 1 capsule (100 mg total) by mouth at bedtime., Disp: 90 capsule, Rfl: 0   glucose blood test strip, Use as instructed, Disp: 100 each, Rfl: 12   metFORMIN  (GLUCOPHAGE ) 1000 MG tablet, TAKE 1 TABLET (1,000 MG TOTAL) BY MOUTH TWICE A DAY WITH FOOD, Disp: 180 tablet, Rfl: 1   Microlet Lancets MISC, 1 application by Does not apply route daily as needed., Disp: 90 each, Rfl: 0   naproxen  (NAPROSYN ) 500 MG tablet, Take 1 tablet (500 mg total) by mouth 2 (two) times daily., Disp: 30 tablet, Rfl: 0   naproxen  (NAPROSYN ) 500 MG tablet, Take 1 tablet by mouth 2 (two) times daily as needed., Disp: , Rfl:  naproxen  sodium (ANAPROX  DS) 550 MG tablet, Take 1 tablet (550 mg total) by mouth 2 (two) times daily with a meal., Disp: 30 tablet, Rfl: 0 Medication Side Effects: none  Family Medical/ Social History: Changes? No  MENTAL HEALTH EXAM:  There were no vitals taken for this visit.There is no height or weight on file to calculate BMI.  General Appearance: Casual, Neat, and Well Groomed  Eye Contact:  Good  Speech:  Clear and Coherent  Volume:  Normal  Mood:  NA  Affect:  Appropriate  Thought Process:  Coherent  Orientation:  Full (Time, Place, and Person)  Thought Content: Logical    Suicidal Thoughts:  No  Homicidal Thoughts:  No  Memory:  WNL  Judgement:  Good  Insight:  Good  Psychomotor Activity:  Normal  Concentration:  Concentration: Good  Recall:  Good  Fund of Knowledge: Good  Language: Good  Assets:  Desire for Improvement  ADL's:  Intact  Cognition: WNL  Prognosis:  Good    DIAGNOSES:    ICD-10-CM   1. Attention deficit hyperactivity disorder (ADHD), combined type  F90.2 Dexmethylphenidate  HCl 40 MG CP24    2. Schizoaffective disorder, bipolar type (HCC)  F25.0       Receiving Psychotherapy: No    RECOMMENDATIONS:   Greater than 50% of 30 min. face to face time with patient was spent on counseling and coordination of care. Discussed his current level of stability. He is happy with his medications right now.  We talked about his continued psychiatric stability with Rexulti .   Active social life. Patient did not want to make medication changes at this time. Counseled patient on making sure he calls one week prior to running out of medications to avoid relapse.   Continue Rexulti  3 mg daily Continue Dexmethylphenidate  40 mg daily after breakfast Will report any side effect or worsening symptoms He is now doing daily meditation with crystals and would like to consider mindfulness and yoga. To follow up in 3 months per pt due to financial concerns Aims score=0     01/01/2024 Reviewed PDMP Discussed potential benefits, risks, and side effects of stimulants with patient to include increased heart rate, palpitations, insomnia, increased anxiety, increased irritability, or decreased appetite.  Instructed patient to contact office if experiencing any significant tolerability issues.          Redell DELENA Pizza, NP

## 2024-02-26 ENCOUNTER — Telehealth: Payer: Self-pay | Admitting: Behavioral Health

## 2024-02-26 ENCOUNTER — Other Ambulatory Visit: Payer: Self-pay

## 2024-02-26 DIAGNOSIS — F902 Attention-deficit hyperactivity disorder, combined type: Secondary | ICD-10-CM

## 2024-02-26 NOTE — Telephone Encounter (Signed)
 Jared Potts called to request RF on his Dexmethylphenidate  40mg  to send to: [id number] - [place], [place] - [address]  Appt 11/26

## 2024-02-26 NOTE — Telephone Encounter (Signed)
 Pended

## 2024-02-27 MED ORDER — DEXMETHYLPHENIDATE HCL ER 40 MG PO CP24: 40.0000 mg | ORAL_CAPSULE | Freq: Every day | ORAL | 0 refills | Status: DC

## 2024-02-27 MED ORDER — DEXMETHYLPHENIDATE HCL ER 40 MG PO CP24
40.0000 mg | ORAL_CAPSULE | Freq: Every day | ORAL | 0 refills | Status: DC
Start: 2024-03-25 — End: 2024-04-01

## 2024-03-27 ENCOUNTER — Ambulatory Visit: Admitting: Family Medicine

## 2024-04-01 ENCOUNTER — Ambulatory Visit (INDEPENDENT_AMBULATORY_CARE_PROVIDER_SITE_OTHER): Admitting: Behavioral Health

## 2024-04-01 ENCOUNTER — Encounter: Payer: Self-pay | Admitting: Behavioral Health

## 2024-04-01 DIAGNOSIS — Z79899 Other long term (current) drug therapy: Secondary | ICD-10-CM

## 2024-04-01 DIAGNOSIS — F902 Attention-deficit hyperactivity disorder, combined type: Secondary | ICD-10-CM

## 2024-04-01 MED ORDER — REXULTI 3 MG PO TABS: 3.0000 mg | ORAL_TABLET | Freq: Every day | ORAL | 3 refills | Status: AC

## 2024-04-01 MED ORDER — DEXMETHYLPHENIDATE HCL ER 40 MG PO CP24: 40.0000 mg | ORAL_CAPSULE | Freq: Every day | ORAL | 0 refills | Status: DC

## 2024-04-01 NOTE — Progress Notes (Signed)
 [facility]  Patient ID: Jared Potts,  MRN: [id number]  PCP: Tanda Bleacher, MD  Date of Evaluation: 04/01/2024 Time spent:20 minutes  Chief Complaint:  Chief Complaint   Depression; Anxiety; ADHD; Follow-up; Medication Refill; Patient Education     HISTORY/CURRENT STATUS: HPI  28 year old male presents to this office for follow up and medication management. Very pleasant and talkative as usual today.  Continues with good stability with current medication regimen.  Requesting no changes to medications at this time.  He is reporting his blood sugar has been consistently high.  Has not had any labs in approximately 1 year.  No follow-ups pending with PCP he agrees to go to [facility] in the next couple of days to have blood work completed.SABRA  He is requesting no medication changes now .  He is diabetic.  He sleeps 7 plus hours per night. No mania, no psychosis, No SI/HI.   Individual Medical History/ Review of Systems: Changes? :No    Allergies: Citrus   Individual Medical History/ Review of Systems: Changes? :No   Allergies: Citrus  Current Medications:  Current Outpatient Medications:    amoxicillin (AMOXIL) 500 MG tablet, Take 500 mg by mouth 3 (three) times daily., Disp: , Rfl:    blood glucose meter kit and supplies KIT, *ONETOUCH ULTRA* Use to test blood sugar daily as needed, Disp: 1 each, Rfl: 0   Blood Glucose Monitoring Suppl (CONTOUR NEXT LINK) w/Device KIT, USE TO TEST BLOOD SUGAR DAILY AS NEEDED, Disp: 1 kit, Rfl: 0   Brexpiprazole  (REXULTI ) 3 MG TABS, Take 1 tablet (3 mg total) by mouth daily after breakfast., Disp: 90 tablet, Rfl: 3   Continuous Glucose Receiver (DEXCOM G7 RECEIVER) DEVI, Use to check blood glucose continuously., Disp: 1 each, Rfl: 0   Continuous Glucose Sensor (DEXCOM G7 SENSOR) MISC, Use to check blood glucose throughout the day. Changes sensors once every 10 days., Disp: 3 each, Rfl: 6   cyclobenzaprine  (FLEXERIL ) 10 MG tablet,  Take 1 tablet (10 mg total) by mouth at bedtime., Disp: 10 tablet, Rfl: 0   Dexmethylphenidate  HCl 40 MG CP24, Take 1 capsule (40 mg total) by mouth daily., Disp: 30 capsule, Rfl: 0   Dexmethylphenidate  HCl 40 MG CP24, Take 1 capsule (40 mg total) by mouth daily., Disp: 30 capsule, Rfl: 0   Dulaglutide  (TRULICITY ) 3 MG/0.5ML SOAJ, as directed Subcutaneous, Disp: 6 mL, Rfl: 1   gabapentin  (NEURONTIN ) 100 MG capsule, Take 1 capsule (100 mg total) by mouth at bedtime., Disp: 90 capsule, Rfl: 0   glucose blood test strip, Use as instructed, Disp: 100 each, Rfl: 12   metFORMIN  (GLUCOPHAGE ) 1000 MG tablet, TAKE 1 TABLET (1,000 MG TOTAL) BY MOUTH TWICE A DAY WITH FOOD, Disp: 180 tablet, Rfl: 1   Microlet Lancets MISC, 1 application by Does not apply route daily as needed., Disp: 90 each, Rfl: 0   naproxen  (NAPROSYN ) 500 MG tablet, Take 1 tablet (500 mg total) by mouth 2 (two) times daily., Disp: 30 tablet, Rfl: 0   naproxen  (NAPROSYN ) 500 MG tablet, Take 1 tablet by mouth 2 (two) times daily as needed., Disp: , Rfl:    naproxen  sodium (ANAPROX  DS) 550 MG tablet, Take 1 tablet (550 mg total) by mouth 2 (two) times daily with a meal., Disp: 30 tablet, Rfl: 0 Medication Side Effects: none  Family Medical/ Social History: Changes? No  MENTAL HEALTH EXAM:  There were no vitals taken for this visit.There is no height or  weight on file to calculate BMI.  General Appearance: Casual, Neat, and Well Groomed  Eye Contact:  Good  Speech:  Clear and Coherent  Volume:  Normal  Mood:  NA  Affect:  Appropriate  Thought Process:  Coherent  Orientation:  Full (Time, Place, and Person)  Thought Content: Logical   Suicidal Thoughts:  No  Homicidal Thoughts:  No  Memory:  WNL  Judgement:  Good  Insight:  Good  Psychomotor Activity:  Normal  Concentration:  Concentration: Good  Recall:  Good  Fund of Knowledge: Good  Language: Good  Assets:  Desire for Improvement  ADL's:  Intact  Cognition: WNL   Prognosis:  Good    DIAGNOSES:    ICD-10-CM   1. High risk medication use  Z79.899 Hemoglobin A1c    Lipid panel    TSH    CBC with Differential/Platelet    2. Attention deficit hyperactivity disorder (ADHD), combined type  F90.2 Dexmethylphenidate  HCl 40 MG CP24    Comprehensive metabolic panel    Brexpiprazole  (REXULTI ) 3 MG TABS      Receiving Psychotherapy: No    RECOMMENDATIONS:   Greater than 50% of 30 min. face to face time with patient was spent on counseling and coordination of care. Discussed his current level of stability. He is happy with his medications right now.  We talked about his continued psychiatric stability with Rexulti .  Has some fear of needles and been reluctant to have labs.  He agrees to go Monday or Tuesday of next week to [facility].  Active social life. Patient did not want to make medication changes at this time.  Counseled patient on taking his diabetic medication as prescribed.  He needs to follow-up with PCP or specialist.  Blood sugar today was 270. We agreed to: Continue Rexulti  3 mg daily Continue Dexmethylphenidate  40 mg daily after breakfast Will report any side effect or worsening symptoms He is now doing daily meditation with crystals and would like to consider mindfulness and yoga. To follow up in 3 months per pt due to financial concerns Aims score=0     04/01/2024 Reviewed PDMP Orders placed for: CBC with differential, CMP, lipid panel, TSH, A1c. Discussed potential benefits, risks, and side effects of stimulants with patient to include increased heart rate, palpitations, insomnia, increased anxiety, increased irritability, or decreased appetite.  Instructed patient to contact office if experiencing any significant tolerability issues.          Redell DELENA Pizza, NP

## 2024-05-04 ENCOUNTER — Other Ambulatory Visit: Payer: Self-pay

## 2024-05-04 ENCOUNTER — Telehealth: Payer: Self-pay | Admitting: Behavioral Health

## 2024-05-04 DIAGNOSIS — F902 Attention-deficit hyperactivity disorder, combined type: Secondary | ICD-10-CM

## 2024-05-04 MED ORDER — DEXMETHYLPHENIDATE HCL ER 40 MG PO CP24: 40.0000 mg | ORAL_CAPSULE | Freq: Every day | ORAL | 0 refills | Status: AC

## 2024-05-04 NOTE — Telephone Encounter (Signed)
 Patient called in for refill on Focalin  40mg . Ph: [phone] Appt 2/26 [facility] [address] [place], [place]

## 2024-05-04 NOTE — Telephone Encounter (Signed)
Pended 3 RF.

## 2024-05-27 ENCOUNTER — Ambulatory Visit: Payer: Self-pay | Admitting: Family Medicine

## 2024-05-27 ENCOUNTER — Ambulatory Visit: Admitting: Family Medicine

## 2024-05-27 ENCOUNTER — Encounter: Payer: Self-pay | Admitting: Family Medicine

## 2024-05-27 VITALS — BP 127/78 | HR 92 | Ht 76.5 in | Wt 242.0 lb

## 2024-05-27 DIAGNOSIS — F250 Schizoaffective disorder, bipolar type: Secondary | ICD-10-CM

## 2024-05-27 DIAGNOSIS — Z7985 Long-term (current) use of injectable non-insulin antidiabetic drugs: Secondary | ICD-10-CM | POA: Diagnosis not present

## 2024-05-27 DIAGNOSIS — F902 Attention-deficit hyperactivity disorder, combined type: Secondary | ICD-10-CM | POA: Diagnosis not present

## 2024-05-27 DIAGNOSIS — E119 Type 2 diabetes mellitus without complications: Secondary | ICD-10-CM

## 2024-05-27 DIAGNOSIS — Z7984 Long term (current) use of oral hypoglycemic drugs: Secondary | ICD-10-CM | POA: Diagnosis not present

## 2024-05-27 LAB — POCT GLYCOSYLATED HEMOGLOBIN (HGB A1C): HbA1c, POC (controlled diabetic range): 11.8 % — AB (ref 0.0–7.0)

## 2024-05-27 MED ORDER — TRULICITY 3 MG/0.5ML ~~LOC~~ SOAJ: SUBCUTANEOUS | 1 refills | Status: AC

## 2024-05-27 MED ORDER — FREESTYLE LIBRE 3 PLUS SENSOR MISC: 0 refills | Status: AC

## 2024-05-27 NOTE — Progress Notes (Signed)
 "  Established Patient Office Visit  Subjective    Patient ID: Jared Potts, male    DOB: [birth year]  Age: 29 y.o. MRN: [id number]  CC:  Chief Complaint  Patient presents with   Medical Management of Chronic Issues    Pt came today with lab orders from his psychiatrist. I explained to him that we cannot draw labs here that are ordered from different provider. He was going to see if Dr. Tanda could order the labs for him to have them done here.  Pt also wanting to switch from dexcom to freestyle libre     HPI Jared Potts presents for follow up of diabetes. He reports that he has had access to soft drinks at his new job and has been drinking them excessively.   Outpatient Encounter Medications as of 05/27/2024  Medication Sig   Brexpiprazole  (REXULTI ) 3 MG TABS Take 1 tablet (3 mg total) by mouth daily after breakfast.   Continuous Glucose Receiver (DEXCOM G7 RECEIVER) DEVI Use to check blood glucose continuously.   Continuous Glucose Sensor (DEXCOM G7 SENSOR) MISC Use to check blood glucose throughout the day. Changes sensors once every 10 days.   Continuous Glucose Sensor (FREESTYLE LIBRE 3 PLUS SENSOR) MISC Change sensor every 15 days.   Dexmethylphenidate  HCl 40 MG CP24 Take 1 capsule (40 mg total) by mouth daily.   metFORMIN  (GLUCOPHAGE ) 1000 MG tablet TAKE 1 TABLET (1,000 MG TOTAL) BY MOUTH TWICE A DAY WITH FOOD   [DISCONTINUED] Dulaglutide  (TRULICITY ) 3 MG/0.5ML SOAJ as directed Subcutaneous   amoxicillin (AMOXIL) 500 MG tablet Take 500 mg by mouth 3 (three) times daily. (Patient not taking: Reported on 05/27/2024)   blood glucose meter kit and supplies KIT *ONETOUCH ULTRA* Use to test blood sugar daily as needed   Blood Glucose Monitoring Suppl (CONTOUR NEXT LINK) w/Device KIT USE TO TEST BLOOD SUGAR DAILY AS NEEDED   cyclobenzaprine  (FLEXERIL ) 10 MG tablet Take 1 tablet (10 mg total) by mouth at bedtime. (Patient not taking: Reported on 05/27/2024)   [START ON 06/01/2024]  Dexmethylphenidate  HCl 40 MG CP24 Take 1 capsule (40 mg total) by mouth daily.   [START ON 06/29/2024] Dexmethylphenidate  HCl 40 MG CP24 Take 1 capsule (40 mg total) by mouth daily.   Dulaglutide  (TRULICITY ) 3 MG/0.5ML SOAJ as directed Subcutaneous   gabapentin  (NEURONTIN ) 100 MG capsule Take 1 capsule (100 mg total) by mouth at bedtime. (Patient not taking: Reported on 05/27/2024)   glucose blood test strip Use as instructed   Microlet Lancets MISC 1 application by Does not apply route daily as needed.   naproxen  (NAPROSYN ) 500 MG tablet Take 1 tablet (500 mg total) by mouth 2 (two) times daily. (Patient not taking: Reported on 05/27/2024)   naproxen  (NAPROSYN ) 500 MG tablet Take 1 tablet by mouth 2 (two) times daily as needed. (Patient not taking: Reported on 05/27/2024)   naproxen  sodium (ANAPROX  DS) 550 MG tablet Take 1 tablet (550 mg total) by mouth 2 (two) times daily with a meal. (Patient not taking: Reported on 05/27/2024)   No facility-administered encounter medications on file as of 05/27/2024.    Past Medical History:  Diagnosis Date   Attention deficit disorder    Combined hyperlipidemia    Goiter    Gynecomastia, male    Hypertension    Noncompliance with treatment    Obesity, Class III, BMI 40-49.9 (morbid obesity) (HCC)    Schizophrenia in children (HCC)    Thyroiditis, autoimmune    Type  2 diabetes mellitus in patient age 41-19 years with HbA1C goal below 7.5     Past Surgical History:  Procedure Laterality Date   CIRCUMCISION      Family History  Problem Relation Age of Onset   Obesity Mother    Diabetes Maternal Aunt    Obesity Maternal Aunt    Diabetes Maternal Grandmother    Obesity Maternal Grandmother    Hypertension Father    Cancer Neg Hx     Social History   Socioeconomic History   Marital status: Single    Spouse name: Not on file   Number of children: Not on file   Years of education: Not on file   Highest education level: Not on file   Occupational History   Not on file  Tobacco Use   Smoking status: Never    Passive exposure: Never   Smokeless tobacco: Never  Vaping Use   Vaping status: Never Used  Substance and Sexual Activity   Alcohol use: Yes    Comment: occasionally   Drug use: No   Sexual activity: Not Currently  Other Topics Concern   Not on file  Social History Narrative   Lives with mom and dad and brother. Likes to make music, hip-hop and rap. Participates in recording.   Social Drivers of Health   Tobacco Use: Low Risk (05/27/2024)   Patient History    Smoking Tobacco Use: Never    Smokeless Tobacco Use: Never    Passive Exposure: Never  Financial Resource Strain: Medium Risk (03/06/2023)   Overall Financial Resource Strain (CARDIA)    Difficulty of Paying Living Expenses: Somewhat hard  Food Insecurity: Food Insecurity Present (03/06/2023)   Hunger Vital Sign    Worried About Running Out of Food in the Last Year: Sometimes true    Ran Out of Food in the Last Year: Sometimes true  Transportation Needs: No Transportation Needs (03/06/2023)   PRAPARE - Administrator, Civil Service (Medical): No    Lack of Transportation (Non-Medical): No  Physical Activity: Sufficiently Active (03/06/2023)   Exercise Vital Sign    Days of Exercise per Week: 7 days    Minutes of Exercise per Session: 30 min  Stress: No Stress Concern Present (03/06/2023)   [employer] of Occupational Health - Occupational Stress Questionnaire    Feeling of Stress : Not at all  Social Connections: Moderately Isolated (03/06/2023)   Social Connection and Isolation Panel    Frequency of Communication with Friends and Family: More than three times a week    Frequency of Social Gatherings with Friends and Family: More than three times a week    Attends Religious Services: More than 4 times per year    Active Member of Clubs or Organizations: No    Attends Banker Meetings: Never    Marital  Status: Never married  Intimate Partner Violence: Not At Risk (03/06/2023)   Humiliation, Afraid, Rape, and Kick questionnaire    Fear of Current or Ex-Partner: No    Emotionally Abused: No    Physically Abused: No    Sexually Abused: No  Depression (PHQ2-9): Low Risk (12/27/2023)   Depression (PHQ2-9)    PHQ-2 Score: 0  Alcohol Screen: Low Risk (03/06/2023)   Alcohol Screen    Last Alcohol Screening Score (AUDIT): 0  Housing: Low Risk (03/06/2023)   Housing    Last Housing Risk Score: 0  Utilities: Not At Risk (03/06/2023)   AHC Utilities  Threatened with loss of utilities: No  Health Literacy: Adequate Health Literacy (03/06/2023)   B1300 Health Literacy    Frequency of need for help with medical instructions: Never    Review of Systems  All other systems reviewed and are negative.       Objective    BP 127/78   Pulse 92   Ht 6' 4.5 (1.943 m)   Wt 242 lb (109.8 kg)   SpO2 97%   BMI 29.07 kg/m   Physical Exam Vitals and nursing note reviewed.  Constitutional:      General: He is not in acute distress. Cardiovascular:     Rate and Rhythm: Normal rate and regular rhythm.  Pulmonary:     Effort: Pulmonary effort is normal.     Breath sounds: Normal breath sounds.  Abdominal:     Palpations: Abdomen is soft.     Tenderness: There is no abdominal tenderness.  Neurological:     General: No focal deficit present.     Mental Status: He is alert and oriented to person, place, and time.  Psychiatric:        Mood and Affect: Mood normal.        Behavior: Behavior normal.         Assessment & Plan:   1. Type 2 diabetes mellitus without complication, without long-term current use of insulin  (HCC) (Primary) Elevated A1c and not near goal. Discussed compliance.referred to [facility] for med management.  - POCT glycosylated hemoglobin (Hb A1C)  2. Long-term current use of injectable noninsulin antidiabetic medication   3. Diabetes mellitus treated with oral  medication (HCC)   4. Schizoaffective disorder, bipolar type [facility]) Management per [facility] consultant.  5. Attention deficit hyperactivity disorder (ADHD), combined type, moderate As above   Return in about 3 months (around 08/25/2024) for follow up, chronic med issues.   Tanda Raguel SQUIBB, MD  "

## 2024-07-02 ENCOUNTER — Ambulatory Visit: Admitting: Behavioral Health
